# Patient Record
Sex: Female | Born: 1940 | Race: White | Hispanic: No | Marital: Married | Smoking: Never smoker
Health system: Southern US, Community
[De-identification: ages and names within clinical notes are randomized; demographics above are authoritative.]

## PROBLEM LIST (undated history)

## (undated) DIAGNOSIS — F32A Depression, unspecified: Secondary | ICD-10-CM

## (undated) DIAGNOSIS — I10 Essential (primary) hypertension: Secondary | ICD-10-CM

## (undated) DIAGNOSIS — K589 Irritable bowel syndrome without diarrhea: Secondary | ICD-10-CM

## (undated) DIAGNOSIS — E559 Vitamin D deficiency, unspecified: Secondary | ICD-10-CM

## (undated) DIAGNOSIS — M1990 Unspecified osteoarthritis, unspecified site: Secondary | ICD-10-CM

## (undated) DIAGNOSIS — C801 Malignant (primary) neoplasm, unspecified: Secondary | ICD-10-CM

## (undated) DIAGNOSIS — E119 Type 2 diabetes mellitus without complications: Secondary | ICD-10-CM

## (undated) DIAGNOSIS — R413 Other amnesia: Secondary | ICD-10-CM

## (undated) DIAGNOSIS — H409 Unspecified glaucoma: Secondary | ICD-10-CM

## (undated) DIAGNOSIS — C50919 Malignant neoplasm of unspecified site of unspecified female breast: Secondary | ICD-10-CM

## (undated) DIAGNOSIS — G510 Bell's palsy: Secondary | ICD-10-CM

## (undated) HISTORY — DX: Malignant neoplasm of unspecified site of unspecified female breast: C50.919

## (undated) HISTORY — DX: Irritable bowel syndrome, unspecified: K58.9

## (undated) HISTORY — PX: LAPAROSCOPIC OVARIAN CYSTECTOMY: SHX6248

## (undated) HISTORY — PX: TONSILLECTOMY: SUR1361

## (undated) HISTORY — DX: Unspecified glaucoma: H40.9

## (undated) HISTORY — DX: Other amnesia: R41.3

## (undated) HISTORY — DX: Depression, unspecified: F32.A

## (undated) HISTORY — DX: Vitamin D deficiency, unspecified: E55.9

## (undated) HISTORY — DX: Bell's palsy: G51.0

## (undated) HISTORY — PX: APPENDECTOMY: SHX54

---

## 1997-07-29 DIAGNOSIS — C801 Malignant (primary) neoplasm, unspecified: Secondary | ICD-10-CM

## 1997-07-29 HISTORY — PX: MASTECTOMY: SHX3

## 1997-07-29 HISTORY — DX: Malignant (primary) neoplasm, unspecified: C80.1

## 1997-09-23 ENCOUNTER — Ambulatory Visit (HOSPITAL_COMMUNITY): Admission: RE | Admit: 1997-09-23 | Discharge: 1997-09-23 | Payer: Self-pay | Admitting: Hematology and Oncology

## 1998-04-11 ENCOUNTER — Other Ambulatory Visit: Admission: RE | Admit: 1998-04-11 | Discharge: 1998-04-11 | Payer: Self-pay | Admitting: *Deleted

## 1998-04-19 ENCOUNTER — Other Ambulatory Visit: Admission: RE | Admit: 1998-04-19 | Discharge: 1998-04-19 | Payer: Self-pay | Admitting: *Deleted

## 1998-08-17 ENCOUNTER — Ambulatory Visit (HOSPITAL_COMMUNITY): Admission: RE | Admit: 1998-08-17 | Discharge: 1998-08-17 | Payer: Self-pay | Admitting: Hematology and Oncology

## 1999-04-23 ENCOUNTER — Other Ambulatory Visit: Admission: RE | Admit: 1999-04-23 | Discharge: 1999-04-23 | Payer: Self-pay | Admitting: *Deleted

## 2000-01-04 ENCOUNTER — Encounter: Admission: RE | Admit: 2000-01-04 | Discharge: 2000-01-04 | Payer: Self-pay | Admitting: General Surgery

## 2000-01-04 ENCOUNTER — Encounter: Payer: Self-pay | Admitting: General Surgery

## 2000-09-25 ENCOUNTER — Encounter: Admission: RE | Admit: 2000-09-25 | Discharge: 2000-09-25 | Payer: Self-pay | Admitting: General Surgery

## 2000-09-25 ENCOUNTER — Encounter: Payer: Self-pay | Admitting: General Surgery

## 2001-06-23 ENCOUNTER — Encounter: Admission: RE | Admit: 2001-06-23 | Discharge: 2001-09-21 | Payer: Self-pay | Admitting: Gastroenterology

## 2001-09-29 ENCOUNTER — Encounter: Admission: RE | Admit: 2001-09-29 | Discharge: 2001-12-28 | Payer: Self-pay | Admitting: Gastroenterology

## 2001-11-19 ENCOUNTER — Encounter: Payer: Self-pay | Admitting: General Surgery

## 2001-11-19 ENCOUNTER — Ambulatory Visit (HOSPITAL_COMMUNITY): Admission: RE | Admit: 2001-11-19 | Discharge: 2001-11-19 | Payer: Self-pay | Admitting: General Surgery

## 2002-06-29 ENCOUNTER — Other Ambulatory Visit: Admission: RE | Admit: 2002-06-29 | Discharge: 2002-06-29 | Payer: Self-pay | Admitting: *Deleted

## 2002-09-06 ENCOUNTER — Encounter: Admission: RE | Admit: 2002-09-06 | Discharge: 2002-09-06 | Payer: Self-pay | Admitting: Internal Medicine

## 2002-09-06 ENCOUNTER — Encounter: Payer: Self-pay | Admitting: Internal Medicine

## 2002-09-22 ENCOUNTER — Ambulatory Visit (HOSPITAL_COMMUNITY): Admission: RE | Admit: 2002-09-22 | Discharge: 2002-09-22 | Payer: Self-pay | Admitting: Gastroenterology

## 2002-10-05 ENCOUNTER — Encounter: Admission: RE | Admit: 2002-10-05 | Discharge: 2002-10-05 | Payer: Self-pay | Admitting: General Surgery

## 2002-10-05 ENCOUNTER — Encounter: Payer: Self-pay | Admitting: General Surgery

## 2005-06-18 ENCOUNTER — Encounter: Admission: RE | Admit: 2005-06-18 | Discharge: 2005-06-18 | Payer: Self-pay | Admitting: General Surgery

## 2006-05-20 ENCOUNTER — Other Ambulatory Visit: Admission: RE | Admit: 2006-05-20 | Discharge: 2006-05-20 | Payer: Self-pay | Admitting: Obstetrics and Gynecology

## 2008-03-08 ENCOUNTER — Encounter: Admission: RE | Admit: 2008-03-08 | Discharge: 2008-03-08 | Payer: Self-pay | Admitting: Gastroenterology

## 2011-10-04 DIAGNOSIS — E1149 Type 2 diabetes mellitus with other diabetic neurological complication: Secondary | ICD-10-CM | POA: Diagnosis not present

## 2011-10-04 DIAGNOSIS — E1349 Other specified diabetes mellitus with other diabetic neurological complication: Secondary | ICD-10-CM | POA: Diagnosis not present

## 2011-10-04 DIAGNOSIS — Z1331 Encounter for screening for depression: Secondary | ICD-10-CM | POA: Diagnosis not present

## 2011-10-04 DIAGNOSIS — I10 Essential (primary) hypertension: Secondary | ICD-10-CM | POA: Diagnosis not present

## 2011-11-05 DIAGNOSIS — H524 Presbyopia: Secondary | ICD-10-CM | POA: Diagnosis not present

## 2011-11-05 DIAGNOSIS — H40019 Open angle with borderline findings, low risk, unspecified eye: Secondary | ICD-10-CM | POA: Diagnosis not present

## 2011-11-05 DIAGNOSIS — H2510 Age-related nuclear cataract, unspecified eye: Secondary | ICD-10-CM | POA: Diagnosis not present

## 2011-11-05 DIAGNOSIS — E119 Type 2 diabetes mellitus without complications: Secondary | ICD-10-CM | POA: Diagnosis not present

## 2012-04-03 DIAGNOSIS — I10 Essential (primary) hypertension: Secondary | ICD-10-CM | POA: Diagnosis not present

## 2012-04-03 DIAGNOSIS — G479 Sleep disorder, unspecified: Secondary | ICD-10-CM | POA: Diagnosis not present

## 2012-04-03 DIAGNOSIS — E1149 Type 2 diabetes mellitus with other diabetic neurological complication: Secondary | ICD-10-CM | POA: Diagnosis not present

## 2012-04-03 DIAGNOSIS — L299 Pruritus, unspecified: Secondary | ICD-10-CM | POA: Diagnosis not present

## 2012-04-03 DIAGNOSIS — E1349 Other specified diabetes mellitus with other diabetic neurological complication: Secondary | ICD-10-CM | POA: Diagnosis not present

## 2012-04-03 DIAGNOSIS — E7800 Pure hypercholesterolemia, unspecified: Secondary | ICD-10-CM | POA: Diagnosis not present

## 2012-05-04 DIAGNOSIS — H524 Presbyopia: Secondary | ICD-10-CM | POA: Diagnosis not present

## 2012-05-04 DIAGNOSIS — H40019 Open angle with borderline findings, low risk, unspecified eye: Secondary | ICD-10-CM | POA: Diagnosis not present

## 2012-10-13 DIAGNOSIS — E1149 Type 2 diabetes mellitus with other diabetic neurological complication: Secondary | ICD-10-CM | POA: Diagnosis not present

## 2012-10-13 DIAGNOSIS — G587 Mononeuritis multiplex: Secondary | ICD-10-CM | POA: Diagnosis not present

## 2012-10-20 DIAGNOSIS — Z1331 Encounter for screening for depression: Secondary | ICD-10-CM | POA: Diagnosis not present

## 2012-10-20 DIAGNOSIS — G587 Mononeuritis multiplex: Secondary | ICD-10-CM | POA: Diagnosis not present

## 2012-10-20 DIAGNOSIS — Z0000 Encounter for general adult medical examination without abnormal findings: Secondary | ICD-10-CM | POA: Diagnosis not present

## 2012-10-20 DIAGNOSIS — I10 Essential (primary) hypertension: Secondary | ICD-10-CM | POA: Diagnosis not present

## 2012-10-20 DIAGNOSIS — E7800 Pure hypercholesterolemia, unspecified: Secondary | ICD-10-CM | POA: Diagnosis not present

## 2012-10-20 DIAGNOSIS — G479 Sleep disorder, unspecified: Secondary | ICD-10-CM | POA: Diagnosis not present

## 2013-04-20 DIAGNOSIS — Z79899 Other long term (current) drug therapy: Secondary | ICD-10-CM | POA: Diagnosis not present

## 2013-04-20 DIAGNOSIS — G479 Sleep disorder, unspecified: Secondary | ICD-10-CM | POA: Diagnosis not present

## 2013-04-20 DIAGNOSIS — E7800 Pure hypercholesterolemia, unspecified: Secondary | ICD-10-CM | POA: Diagnosis not present

## 2013-04-20 DIAGNOSIS — I10 Essential (primary) hypertension: Secondary | ICD-10-CM | POA: Diagnosis not present

## 2013-05-04 DIAGNOSIS — H40019 Open angle with borderline findings, low risk, unspecified eye: Secondary | ICD-10-CM | POA: Diagnosis not present

## 2013-05-04 DIAGNOSIS — H2510 Age-related nuclear cataract, unspecified eye: Secondary | ICD-10-CM | POA: Diagnosis not present

## 2013-05-04 DIAGNOSIS — E119 Type 2 diabetes mellitus without complications: Secondary | ICD-10-CM | POA: Diagnosis not present

## 2013-05-04 DIAGNOSIS — H5200 Hypermetropia, unspecified eye: Secondary | ICD-10-CM | POA: Diagnosis not present

## 2013-10-25 DIAGNOSIS — G587 Mononeuritis multiplex: Secondary | ICD-10-CM | POA: Diagnosis not present

## 2013-10-25 DIAGNOSIS — Z0000 Encounter for general adult medical examination without abnormal findings: Secondary | ICD-10-CM | POA: Diagnosis not present

## 2013-10-25 DIAGNOSIS — I10 Essential (primary) hypertension: Secondary | ICD-10-CM | POA: Diagnosis not present

## 2013-10-25 DIAGNOSIS — Z79899 Other long term (current) drug therapy: Secondary | ICD-10-CM | POA: Diagnosis not present

## 2013-10-25 DIAGNOSIS — G479 Sleep disorder, unspecified: Secondary | ICD-10-CM | POA: Diagnosis not present

## 2013-10-25 DIAGNOSIS — E7800 Pure hypercholesterolemia, unspecified: Secondary | ICD-10-CM | POA: Diagnosis not present

## 2013-10-25 DIAGNOSIS — Z1331 Encounter for screening for depression: Secondary | ICD-10-CM | POA: Diagnosis not present

## 2013-10-25 DIAGNOSIS — IMO0001 Reserved for inherently not codable concepts without codable children: Secondary | ICD-10-CM | POA: Diagnosis not present

## 2013-10-25 DIAGNOSIS — R209 Unspecified disturbances of skin sensation: Secondary | ICD-10-CM | POA: Diagnosis not present

## 2013-11-12 DIAGNOSIS — H409 Unspecified glaucoma: Secondary | ICD-10-CM | POA: Diagnosis not present

## 2013-11-12 DIAGNOSIS — H4011X0 Primary open-angle glaucoma, stage unspecified: Secondary | ICD-10-CM | POA: Diagnosis not present

## 2014-04-01 DIAGNOSIS — H4010X0 Unspecified open-angle glaucoma, stage unspecified: Secondary | ICD-10-CM | POA: Diagnosis not present

## 2014-05-03 DIAGNOSIS — I10 Essential (primary) hypertension: Secondary | ICD-10-CM | POA: Diagnosis not present

## 2014-05-03 DIAGNOSIS — E1165 Type 2 diabetes mellitus with hyperglycemia: Secondary | ICD-10-CM | POA: Diagnosis not present

## 2014-05-03 DIAGNOSIS — Z0001 Encounter for general adult medical examination with abnormal findings: Secondary | ICD-10-CM | POA: Diagnosis not present

## 2014-06-03 DIAGNOSIS — I10 Essential (primary) hypertension: Secondary | ICD-10-CM | POA: Diagnosis not present

## 2014-06-03 DIAGNOSIS — I739 Peripheral vascular disease, unspecified: Secondary | ICD-10-CM | POA: Diagnosis not present

## 2014-06-03 DIAGNOSIS — E1140 Type 2 diabetes mellitus with diabetic neuropathy, unspecified: Secondary | ICD-10-CM | POA: Diagnosis not present

## 2014-06-03 DIAGNOSIS — L299 Pruritus, unspecified: Secondary | ICD-10-CM | POA: Diagnosis not present

## 2014-06-03 DIAGNOSIS — E1165 Type 2 diabetes mellitus with hyperglycemia: Secondary | ICD-10-CM | POA: Diagnosis not present

## 2014-10-03 DIAGNOSIS — H4011X1 Primary open-angle glaucoma, mild stage: Secondary | ICD-10-CM | POA: Diagnosis not present

## 2014-11-04 DIAGNOSIS — H4011X1 Primary open-angle glaucoma, mild stage: Secondary | ICD-10-CM | POA: Diagnosis not present

## 2014-11-08 DIAGNOSIS — I739 Peripheral vascular disease, unspecified: Secondary | ICD-10-CM | POA: Diagnosis not present

## 2014-11-08 DIAGNOSIS — E0840 Diabetes mellitus due to underlying condition with diabetic neuropathy, unspecified: Secondary | ICD-10-CM | POA: Diagnosis not present

## 2014-11-08 DIAGNOSIS — Z1389 Encounter for screening for other disorder: Secondary | ICD-10-CM | POA: Diagnosis not present

## 2014-11-08 DIAGNOSIS — Z0001 Encounter for general adult medical examination with abnormal findings: Secondary | ICD-10-CM | POA: Diagnosis not present

## 2014-11-08 DIAGNOSIS — E1165 Type 2 diabetes mellitus with hyperglycemia: Secondary | ICD-10-CM | POA: Diagnosis not present

## 2014-11-08 DIAGNOSIS — G479 Sleep disorder, unspecified: Secondary | ICD-10-CM | POA: Diagnosis not present

## 2014-11-08 DIAGNOSIS — I10 Essential (primary) hypertension: Secondary | ICD-10-CM | POA: Diagnosis not present

## 2014-11-08 DIAGNOSIS — E1140 Type 2 diabetes mellitus with diabetic neuropathy, unspecified: Secondary | ICD-10-CM | POA: Diagnosis not present

## 2015-05-04 DIAGNOSIS — H401131 Primary open-angle glaucoma, bilateral, mild stage: Secondary | ICD-10-CM | POA: Diagnosis not present

## 2015-05-11 DIAGNOSIS — H401131 Primary open-angle glaucoma, bilateral, mild stage: Secondary | ICD-10-CM | POA: Diagnosis not present

## 2015-06-12 DIAGNOSIS — G479 Sleep disorder, unspecified: Secondary | ICD-10-CM | POA: Diagnosis not present

## 2015-06-12 DIAGNOSIS — B839 Helminthiasis, unspecified: Secondary | ICD-10-CM | POA: Diagnosis not present

## 2015-06-12 DIAGNOSIS — E0840 Diabetes mellitus due to underlying condition with diabetic neuropathy, unspecified: Secondary | ICD-10-CM | POA: Diagnosis not present

## 2015-06-12 DIAGNOSIS — I10 Essential (primary) hypertension: Secondary | ICD-10-CM | POA: Diagnosis not present

## 2015-06-12 DIAGNOSIS — I739 Peripheral vascular disease, unspecified: Secondary | ICD-10-CM | POA: Diagnosis not present

## 2015-06-12 DIAGNOSIS — E1165 Type 2 diabetes mellitus with hyperglycemia: Secondary | ICD-10-CM | POA: Diagnosis not present

## 2015-11-08 DIAGNOSIS — R3915 Urgency of urination: Secondary | ICD-10-CM | POA: Diagnosis not present

## 2015-11-16 DIAGNOSIS — H401131 Primary open-angle glaucoma, bilateral, mild stage: Secondary | ICD-10-CM | POA: Diagnosis not present

## 2015-11-20 DIAGNOSIS — Z7984 Long term (current) use of oral hypoglycemic drugs: Secondary | ICD-10-CM | POA: Diagnosis not present

## 2015-11-20 DIAGNOSIS — Z79899 Other long term (current) drug therapy: Secondary | ICD-10-CM | POA: Diagnosis not present

## 2015-11-20 DIAGNOSIS — B839 Helminthiasis, unspecified: Secondary | ICD-10-CM | POA: Diagnosis not present

## 2015-11-20 DIAGNOSIS — Z1389 Encounter for screening for other disorder: Secondary | ICD-10-CM | POA: Diagnosis not present

## 2015-11-20 DIAGNOSIS — E1140 Type 2 diabetes mellitus with diabetic neuropathy, unspecified: Secondary | ICD-10-CM | POA: Diagnosis not present

## 2015-11-20 DIAGNOSIS — E1165 Type 2 diabetes mellitus with hyperglycemia: Secondary | ICD-10-CM | POA: Diagnosis not present

## 2015-11-20 DIAGNOSIS — I10 Essential (primary) hypertension: Secondary | ICD-10-CM | POA: Diagnosis not present

## 2015-11-20 DIAGNOSIS — E559 Vitamin D deficiency, unspecified: Secondary | ICD-10-CM | POA: Diagnosis not present

## 2015-11-20 DIAGNOSIS — E785 Hyperlipidemia, unspecified: Secondary | ICD-10-CM | POA: Diagnosis not present

## 2015-11-20 DIAGNOSIS — Z0001 Encounter for general adult medical examination with abnormal findings: Secondary | ICD-10-CM | POA: Diagnosis not present

## 2015-11-20 DIAGNOSIS — I739 Peripheral vascular disease, unspecified: Secondary | ICD-10-CM | POA: Diagnosis not present

## 2016-01-09 ENCOUNTER — Other Ambulatory Visit: Payer: Self-pay | Admitting: Internal Medicine

## 2016-01-09 DIAGNOSIS — N644 Mastodynia: Secondary | ICD-10-CM

## 2016-01-10 ENCOUNTER — Other Ambulatory Visit: Payer: Self-pay | Admitting: Internal Medicine

## 2016-01-10 DIAGNOSIS — N644 Mastodynia: Secondary | ICD-10-CM

## 2016-01-16 ENCOUNTER — Ambulatory Visit
Admission: RE | Admit: 2016-01-16 | Discharge: 2016-01-16 | Disposition: A | Discharge status: Home / Self Care | Payer: Medicare Other | Source: Ambulatory Visit | Attending: Internal Medicine | Admitting: Internal Medicine

## 2016-01-16 ENCOUNTER — Other Ambulatory Visit: Payer: Self-pay | Admitting: Internal Medicine

## 2016-01-16 DIAGNOSIS — N644 Mastodynia: Secondary | ICD-10-CM

## 2016-01-16 DIAGNOSIS — R922 Inconclusive mammogram: Secondary | ICD-10-CM | POA: Diagnosis not present

## 2016-02-02 DIAGNOSIS — I739 Peripheral vascular disease, unspecified: Secondary | ICD-10-CM | POA: Diagnosis not present

## 2016-02-02 DIAGNOSIS — B839 Helminthiasis, unspecified: Secondary | ICD-10-CM | POA: Diagnosis not present

## 2016-02-02 DIAGNOSIS — E1165 Type 2 diabetes mellitus with hyperglycemia: Secondary | ICD-10-CM | POA: Diagnosis not present

## 2016-02-02 DIAGNOSIS — E785 Hyperlipidemia, unspecified: Secondary | ICD-10-CM | POA: Diagnosis not present

## 2016-02-02 DIAGNOSIS — E1140 Type 2 diabetes mellitus with diabetic neuropathy, unspecified: Secondary | ICD-10-CM | POA: Diagnosis not present

## 2016-02-02 DIAGNOSIS — I10 Essential (primary) hypertension: Secondary | ICD-10-CM | POA: Diagnosis not present

## 2016-02-02 DIAGNOSIS — E559 Vitamin D deficiency, unspecified: Secondary | ICD-10-CM | POA: Diagnosis not present

## 2016-02-02 DIAGNOSIS — Z7984 Long term (current) use of oral hypoglycemic drugs: Secondary | ICD-10-CM | POA: Diagnosis not present

## 2016-05-15 DIAGNOSIS — H401131 Primary open-angle glaucoma, bilateral, mild stage: Secondary | ICD-10-CM | POA: Diagnosis not present

## 2016-05-23 DIAGNOSIS — E119 Type 2 diabetes mellitus without complications: Secondary | ICD-10-CM | POA: Diagnosis not present

## 2016-08-13 ENCOUNTER — Other Ambulatory Visit: Payer: Self-pay | Admitting: Internal Medicine

## 2016-08-13 DIAGNOSIS — M7989 Other specified soft tissue disorders: Secondary | ICD-10-CM

## 2016-08-13 DIAGNOSIS — E1165 Type 2 diabetes mellitus with hyperglycemia: Secondary | ICD-10-CM | POA: Diagnosis not present

## 2016-08-13 DIAGNOSIS — Z7984 Long term (current) use of oral hypoglycemic drugs: Secondary | ICD-10-CM | POA: Diagnosis not present

## 2016-08-13 DIAGNOSIS — R202 Paresthesia of skin: Secondary | ICD-10-CM | POA: Diagnosis not present

## 2016-08-13 DIAGNOSIS — M79672 Pain in left foot: Secondary | ICD-10-CM | POA: Diagnosis not present

## 2016-08-14 ENCOUNTER — Other Ambulatory Visit: Payer: Medicare Other

## 2016-08-15 ENCOUNTER — Other Ambulatory Visit: Payer: Self-pay | Admitting: Internal Medicine

## 2016-08-15 DIAGNOSIS — M7989 Other specified soft tissue disorders: Secondary | ICD-10-CM

## 2016-08-16 ENCOUNTER — Ambulatory Visit
Admission: RE | Admit: 2016-08-16 | Discharge: 2016-08-16 | Disposition: A | Discharge status: Home / Self Care | Payer: Medicare Other | Source: Ambulatory Visit | Attending: Internal Medicine | Admitting: Internal Medicine

## 2016-08-16 DIAGNOSIS — M7989 Other specified soft tissue disorders: Secondary | ICD-10-CM

## 2016-08-16 DIAGNOSIS — R600 Localized edema: Secondary | ICD-10-CM | POA: Diagnosis not present

## 2016-09-17 DIAGNOSIS — M7989 Other specified soft tissue disorders: Secondary | ICD-10-CM | POA: Diagnosis not present

## 2016-09-17 DIAGNOSIS — R202 Paresthesia of skin: Secondary | ICD-10-CM | POA: Diagnosis not present

## 2016-09-17 DIAGNOSIS — M79672 Pain in left foot: Secondary | ICD-10-CM | POA: Diagnosis not present

## 2016-09-17 DIAGNOSIS — E1165 Type 2 diabetes mellitus with hyperglycemia: Secondary | ICD-10-CM | POA: Diagnosis not present

## 2016-11-19 DIAGNOSIS — H401131 Primary open-angle glaucoma, bilateral, mild stage: Secondary | ICD-10-CM | POA: Diagnosis not present

## 2017-01-06 DIAGNOSIS — R202 Paresthesia of skin: Secondary | ICD-10-CM | POA: Diagnosis not present

## 2017-01-06 DIAGNOSIS — Z7984 Long term (current) use of oral hypoglycemic drugs: Secondary | ICD-10-CM | POA: Diagnosis not present

## 2017-01-06 DIAGNOSIS — E1140 Type 2 diabetes mellitus with diabetic neuropathy, unspecified: Secondary | ICD-10-CM | POA: Diagnosis not present

## 2017-01-06 DIAGNOSIS — I10 Essential (primary) hypertension: Secondary | ICD-10-CM | POA: Diagnosis not present

## 2017-01-06 DIAGNOSIS — E1165 Type 2 diabetes mellitus with hyperglycemia: Secondary | ICD-10-CM | POA: Diagnosis not present

## 2017-01-06 DIAGNOSIS — R1012 Left upper quadrant pain: Secondary | ICD-10-CM | POA: Diagnosis not present

## 2017-01-06 DIAGNOSIS — M79672 Pain in left foot: Secondary | ICD-10-CM | POA: Diagnosis not present

## 2017-01-06 DIAGNOSIS — E559 Vitamin D deficiency, unspecified: Secondary | ICD-10-CM | POA: Diagnosis not present

## 2017-01-06 DIAGNOSIS — Z1211 Encounter for screening for malignant neoplasm of colon: Secondary | ICD-10-CM | POA: Diagnosis not present

## 2017-01-06 DIAGNOSIS — I739 Peripheral vascular disease, unspecified: Secondary | ICD-10-CM | POA: Diagnosis not present

## 2017-01-06 DIAGNOSIS — E785 Hyperlipidemia, unspecified: Secondary | ICD-10-CM | POA: Diagnosis not present

## 2017-01-06 DIAGNOSIS — M7989 Other specified soft tissue disorders: Secondary | ICD-10-CM | POA: Diagnosis not present

## 2017-01-07 ENCOUNTER — Other Ambulatory Visit: Payer: Self-pay | Admitting: Internal Medicine

## 2017-01-07 DIAGNOSIS — R748 Abnormal levels of other serum enzymes: Secondary | ICD-10-CM

## 2017-01-07 DIAGNOSIS — R1012 Left upper quadrant pain: Secondary | ICD-10-CM

## 2017-01-13 ENCOUNTER — Other Ambulatory Visit: Payer: Self-pay | Admitting: Internal Medicine

## 2017-01-13 DIAGNOSIS — Z9011 Acquired absence of right breast and nipple: Secondary | ICD-10-CM

## 2017-01-13 DIAGNOSIS — Z853 Personal history of malignant neoplasm of breast: Secondary | ICD-10-CM

## 2017-01-16 ENCOUNTER — Ambulatory Visit
Admission: RE | Admit: 2017-01-16 | Discharge: 2017-01-16 | Disposition: A | Discharge status: Home / Self Care | Payer: Medicare Other | Source: Ambulatory Visit | Attending: Internal Medicine | Admitting: Internal Medicine

## 2017-01-16 ENCOUNTER — Encounter: Payer: Self-pay | Admitting: Radiology

## 2017-01-16 DIAGNOSIS — R928 Other abnormal and inconclusive findings on diagnostic imaging of breast: Secondary | ICD-10-CM | POA: Diagnosis not present

## 2017-01-16 DIAGNOSIS — Z853 Personal history of malignant neoplasm of breast: Secondary | ICD-10-CM

## 2017-01-16 DIAGNOSIS — Z9011 Acquired absence of right breast and nipple: Secondary | ICD-10-CM

## 2017-01-16 HISTORY — DX: Malignant (primary) neoplasm, unspecified: C80.1

## 2017-01-20 DIAGNOSIS — R748 Abnormal levels of other serum enzymes: Secondary | ICD-10-CM | POA: Diagnosis not present

## 2017-02-19 DIAGNOSIS — G4700 Insomnia, unspecified: Secondary | ICD-10-CM | POA: Diagnosis not present

## 2017-02-19 DIAGNOSIS — I10 Essential (primary) hypertension: Secondary | ICD-10-CM | POA: Diagnosis not present

## 2017-02-19 DIAGNOSIS — E785 Hyperlipidemia, unspecified: Secondary | ICD-10-CM | POA: Diagnosis not present

## 2017-02-19 DIAGNOSIS — I739 Peripheral vascular disease, unspecified: Secondary | ICD-10-CM | POA: Diagnosis not present

## 2017-02-19 DIAGNOSIS — E1140 Type 2 diabetes mellitus with diabetic neuropathy, unspecified: Secondary | ICD-10-CM | POA: Diagnosis not present

## 2017-02-19 DIAGNOSIS — R748 Abnormal levels of other serum enzymes: Secondary | ICD-10-CM | POA: Diagnosis not present

## 2017-02-19 DIAGNOSIS — E1165 Type 2 diabetes mellitus with hyperglycemia: Secondary | ICD-10-CM | POA: Diagnosis not present

## 2017-02-19 DIAGNOSIS — E559 Vitamin D deficiency, unspecified: Secondary | ICD-10-CM | POA: Diagnosis not present

## 2017-05-23 DIAGNOSIS — H401131 Primary open-angle glaucoma, bilateral, mild stage: Secondary | ICD-10-CM | POA: Diagnosis not present

## 2017-05-30 DIAGNOSIS — H40003 Preglaucoma, unspecified, bilateral: Secondary | ICD-10-CM | POA: Diagnosis not present

## 2017-07-16 DIAGNOSIS — E1165 Type 2 diabetes mellitus with hyperglycemia: Secondary | ICD-10-CM | POA: Diagnosis not present

## 2017-07-16 DIAGNOSIS — I10 Essential (primary) hypertension: Secondary | ICD-10-CM | POA: Diagnosis not present

## 2017-07-16 DIAGNOSIS — Z0000 Encounter for general adult medical examination without abnormal findings: Secondary | ICD-10-CM | POA: Diagnosis not present

## 2017-07-16 DIAGNOSIS — R748 Abnormal levels of other serum enzymes: Secondary | ICD-10-CM | POA: Diagnosis not present

## 2017-07-16 DIAGNOSIS — I739 Peripheral vascular disease, unspecified: Secondary | ICD-10-CM | POA: Diagnosis not present

## 2017-07-16 DIAGNOSIS — E785 Hyperlipidemia, unspecified: Secondary | ICD-10-CM | POA: Diagnosis not present

## 2017-07-16 DIAGNOSIS — G4700 Insomnia, unspecified: Secondary | ICD-10-CM | POA: Diagnosis not present

## 2017-07-16 DIAGNOSIS — Z79899 Other long term (current) drug therapy: Secondary | ICD-10-CM | POA: Diagnosis not present

## 2017-07-16 DIAGNOSIS — E559 Vitamin D deficiency, unspecified: Secondary | ICD-10-CM | POA: Diagnosis not present

## 2017-07-16 DIAGNOSIS — Z1389 Encounter for screening for other disorder: Secondary | ICD-10-CM | POA: Diagnosis not present

## 2017-07-16 DIAGNOSIS — E1140 Type 2 diabetes mellitus with diabetic neuropathy, unspecified: Secondary | ICD-10-CM | POA: Diagnosis not present

## 2017-07-16 DIAGNOSIS — Z7189 Other specified counseling: Secondary | ICD-10-CM | POA: Diagnosis not present

## 2017-07-31 DIAGNOSIS — E559 Vitamin D deficiency, unspecified: Secondary | ICD-10-CM | POA: Diagnosis not present

## 2017-07-31 DIAGNOSIS — E782 Mixed hyperlipidemia: Secondary | ICD-10-CM | POA: Diagnosis not present

## 2017-07-31 DIAGNOSIS — I10 Essential (primary) hypertension: Secondary | ICD-10-CM | POA: Diagnosis not present

## 2017-07-31 DIAGNOSIS — E785 Hyperlipidemia, unspecified: Secondary | ICD-10-CM | POA: Diagnosis not present

## 2017-07-31 DIAGNOSIS — R748 Abnormal levels of other serum enzymes: Secondary | ICD-10-CM | POA: Diagnosis not present

## 2017-07-31 DIAGNOSIS — E1165 Type 2 diabetes mellitus with hyperglycemia: Secondary | ICD-10-CM | POA: Diagnosis not present

## 2017-11-27 DIAGNOSIS — H401131 Primary open-angle glaucoma, bilateral, mild stage: Secondary | ICD-10-CM | POA: Diagnosis not present

## 2018-01-15 DIAGNOSIS — R748 Abnormal levels of other serum enzymes: Secondary | ICD-10-CM | POA: Diagnosis not present

## 2018-01-15 DIAGNOSIS — G4700 Insomnia, unspecified: Secondary | ICD-10-CM | POA: Diagnosis not present

## 2018-01-15 DIAGNOSIS — E1165 Type 2 diabetes mellitus with hyperglycemia: Secondary | ICD-10-CM | POA: Diagnosis not present

## 2018-01-15 DIAGNOSIS — Z7984 Long term (current) use of oral hypoglycemic drugs: Secondary | ICD-10-CM | POA: Diagnosis not present

## 2018-01-15 DIAGNOSIS — I10 Essential (primary) hypertension: Secondary | ICD-10-CM | POA: Diagnosis not present

## 2018-01-15 DIAGNOSIS — E559 Vitamin D deficiency, unspecified: Secondary | ICD-10-CM | POA: Diagnosis not present

## 2018-01-15 DIAGNOSIS — E782 Mixed hyperlipidemia: Secondary | ICD-10-CM | POA: Diagnosis not present

## 2018-05-28 DIAGNOSIS — H401131 Primary open-angle glaucoma, bilateral, mild stage: Secondary | ICD-10-CM | POA: Diagnosis not present

## 2018-06-04 DIAGNOSIS — H401131 Primary open-angle glaucoma, bilateral, mild stage: Secondary | ICD-10-CM | POA: Diagnosis not present

## 2018-06-22 IMAGING — MG 2D DIGITAL DIAGNOSTIC UNILATERAL LEFT MAMMOGRAM WITH CAD AND ADJ
6 series · 6 of 14 positions shown · non-contrast
Comparison: Previous exam(s).

CLINICAL DATA: 76-year-old patient with history of right mastectomy
approximately 19 years ago. She has recently had left breast pain as
well as thoracic and abdominal pain. She describes taking some
homeopathic treatment for her pancreas, and has had some improvement
in her pain. She does not palpate any lumps.

EXAM:
2D DIGITAL DIAGNOSTIC UNILATERAL LEFT MAMMOGRAM WITH CAD AND ADJUNCT
TOMO

[L CC synth-2D]
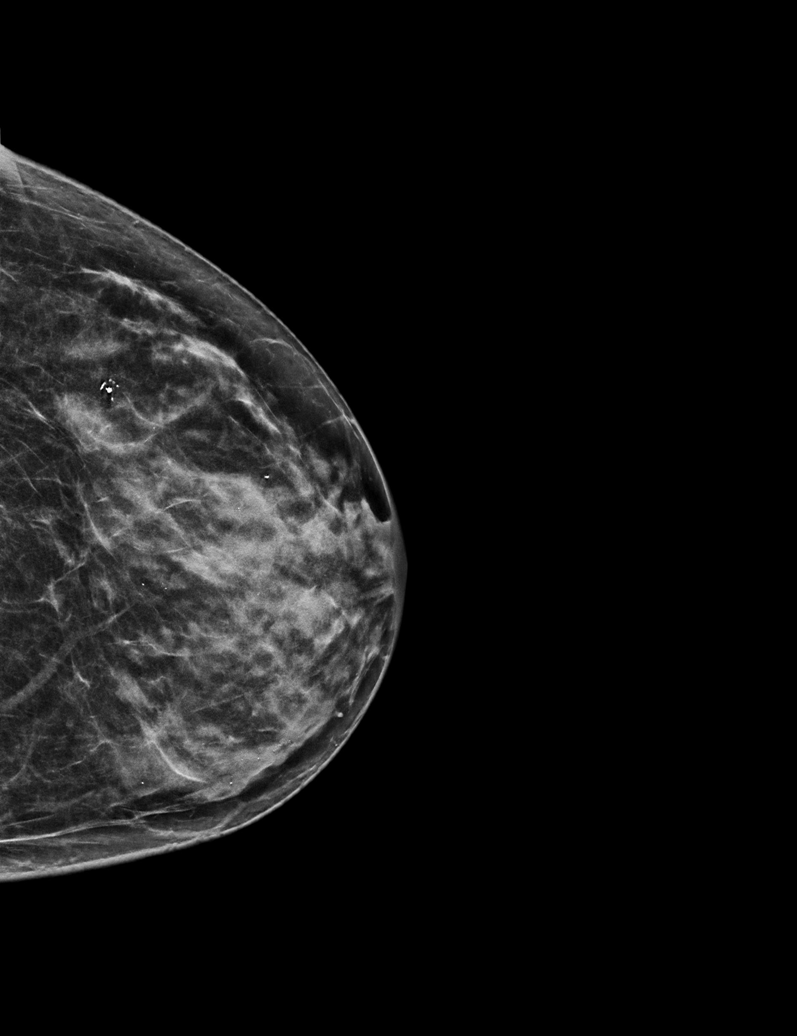

[L MLO]
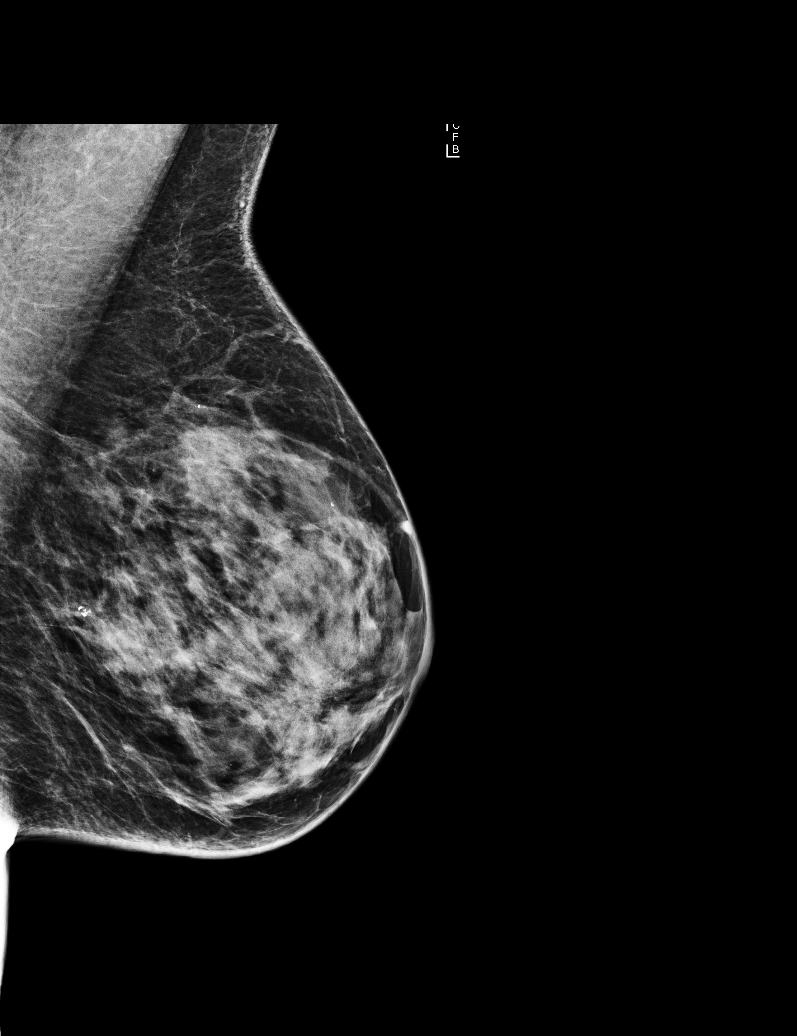

[L MLO synth-2D]
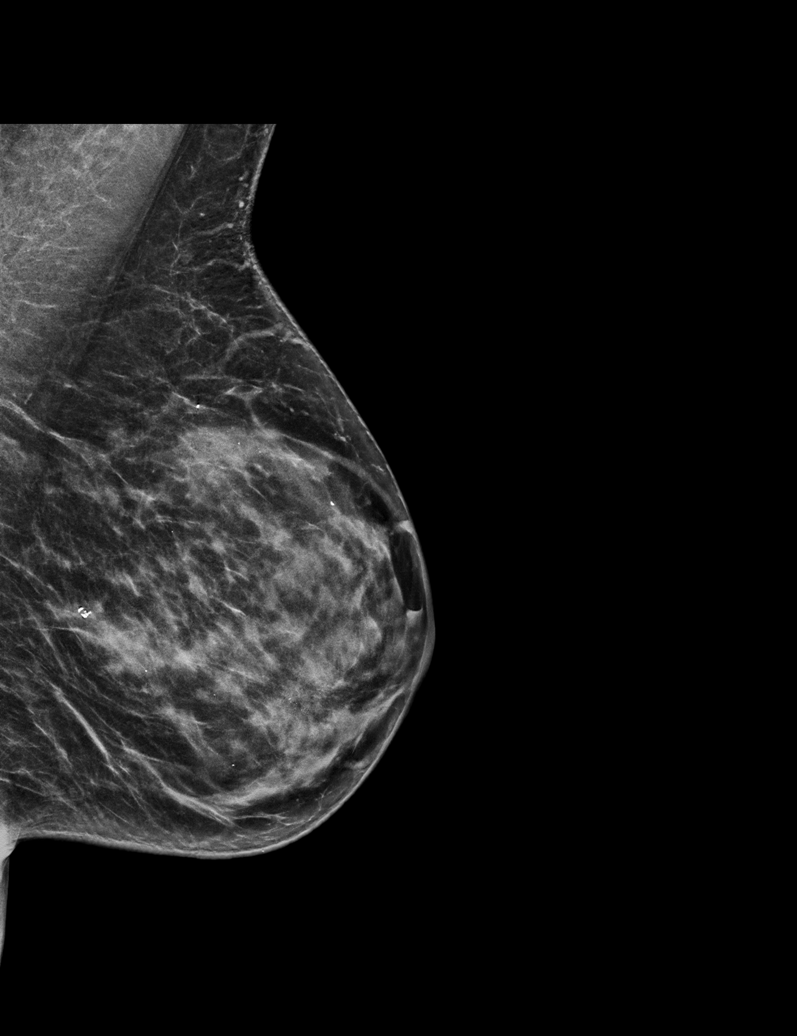

[L CC]
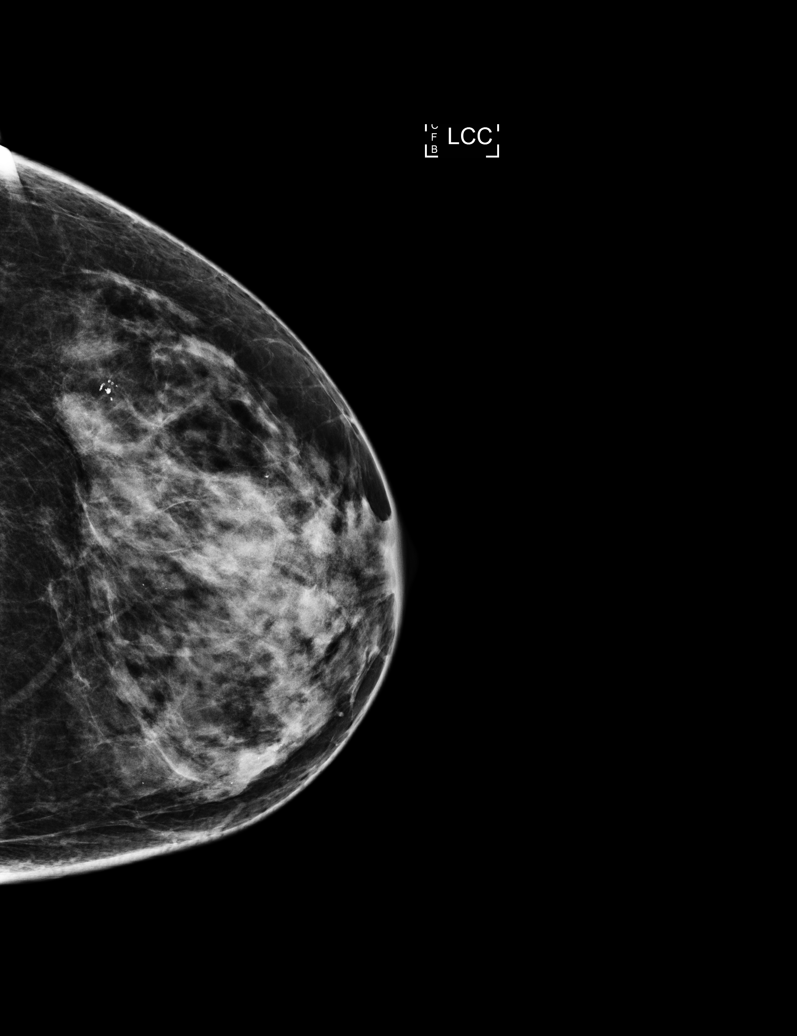

[L MLO tomo · tomo slice 28/55.0]
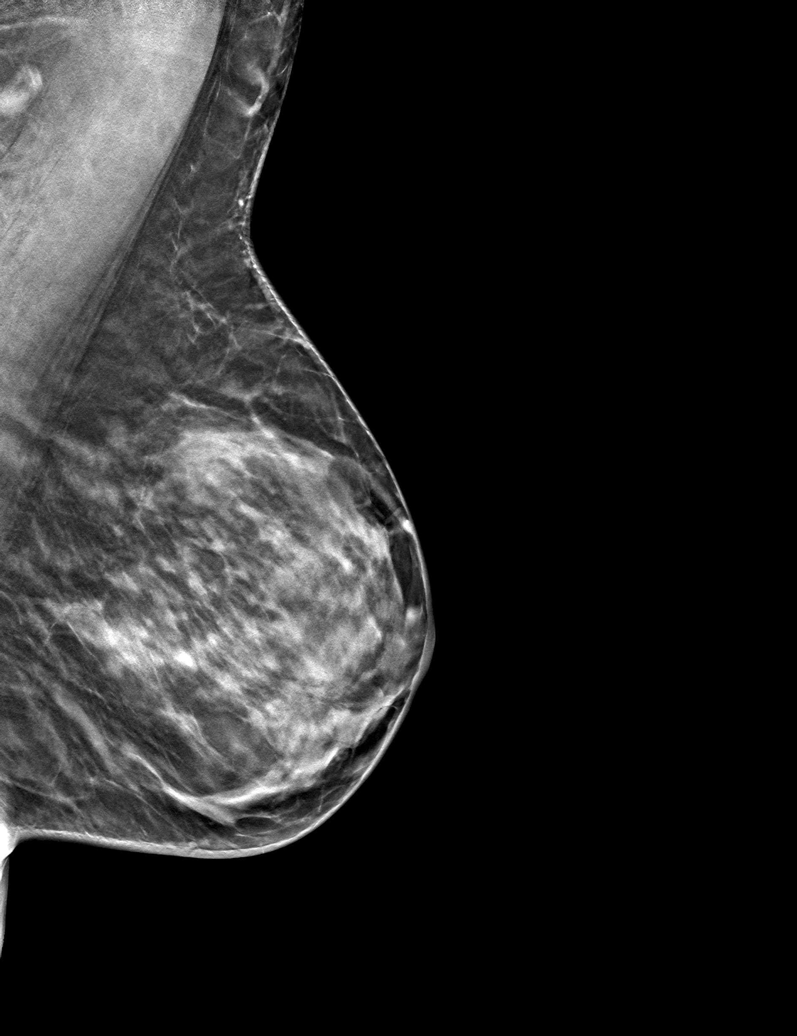

[L CC tomo · tomo slice 25/50.0]
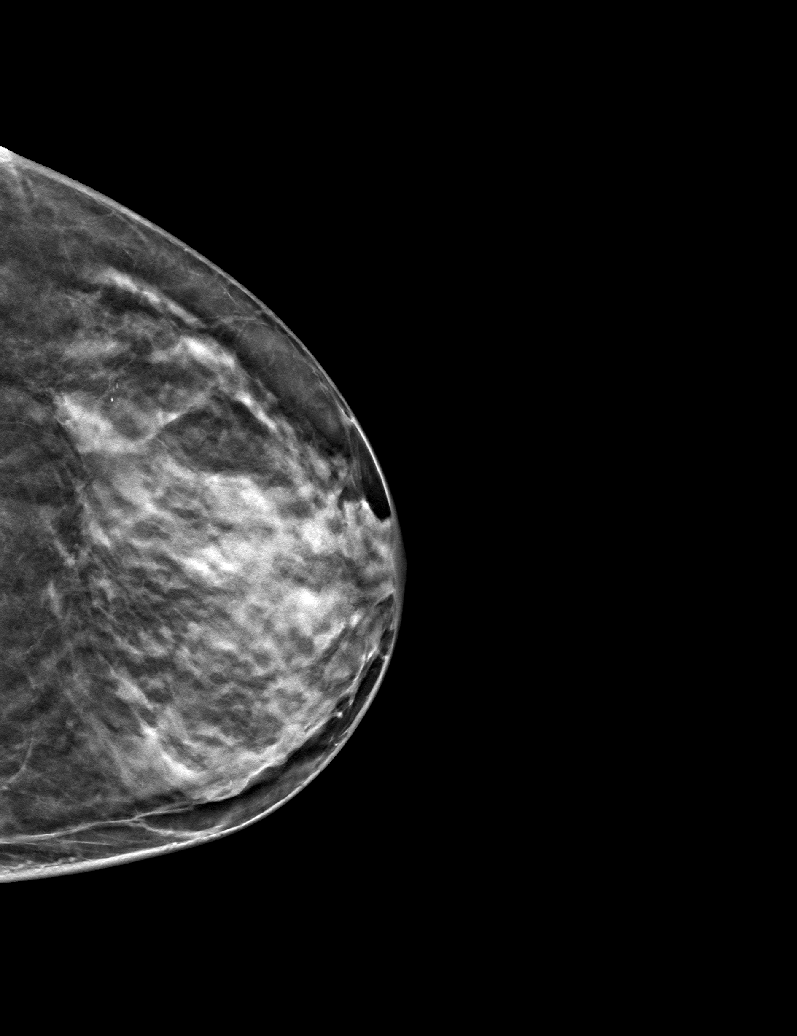

[6 of 14 positions shown; findings below may reference images not displayed]

ACR Breast Density Category c: The breast tissue is heterogeneously
dense, which may obscure small masses.
FINDINGS: No mass, architectural distortion, or suspicious microcalcification
is identified in the left breast to suggest malignancy. Parenchymal
pattern appears stable.

Mammographic images were processed with CAD.
IMPRESSION: No evidence of malignancy in the left breast.

RECOMMENDATION:
Screening mammogram in one year.(Code:C5-D-IUJ)

I have discussed the findings and recommendations with the patient.
Results were also provided in writing at the conclusion of the
visit. If applicable, a reminder letter will be sent to the patient
regarding the next appointment.

BI-RADS CATEGORY  1: Negative.

## 2018-08-18 DIAGNOSIS — E1165 Type 2 diabetes mellitus with hyperglycemia: Secondary | ICD-10-CM | POA: Diagnosis not present

## 2018-08-18 DIAGNOSIS — E559 Vitamin D deficiency, unspecified: Secondary | ICD-10-CM | POA: Diagnosis not present

## 2018-08-18 DIAGNOSIS — I10 Essential (primary) hypertension: Secondary | ICD-10-CM | POA: Diagnosis not present

## 2018-08-18 DIAGNOSIS — E782 Mixed hyperlipidemia: Secondary | ICD-10-CM | POA: Diagnosis not present

## 2018-08-18 DIAGNOSIS — G4700 Insomnia, unspecified: Secondary | ICD-10-CM | POA: Diagnosis not present

## 2018-08-18 DIAGNOSIS — R748 Abnormal levels of other serum enzymes: Secondary | ICD-10-CM | POA: Diagnosis not present

## 2018-08-18 DIAGNOSIS — F4310 Post-traumatic stress disorder, unspecified: Secondary | ICD-10-CM | POA: Diagnosis not present

## 2018-08-18 DIAGNOSIS — Z1389 Encounter for screening for other disorder: Secondary | ICD-10-CM | POA: Diagnosis not present

## 2018-08-18 DIAGNOSIS — Z0000 Encounter for general adult medical examination without abnormal findings: Secondary | ICD-10-CM | POA: Diagnosis not present

## 2018-09-17 DIAGNOSIS — G4700 Insomnia, unspecified: Secondary | ICD-10-CM | POA: Diagnosis not present

## 2018-09-17 DIAGNOSIS — E782 Mixed hyperlipidemia: Secondary | ICD-10-CM | POA: Diagnosis not present

## 2018-09-17 DIAGNOSIS — R748 Abnormal levels of other serum enzymes: Secondary | ICD-10-CM | POA: Diagnosis not present

## 2018-09-17 DIAGNOSIS — E1165 Type 2 diabetes mellitus with hyperglycemia: Secondary | ICD-10-CM | POA: Diagnosis not present

## 2018-09-17 DIAGNOSIS — I10 Essential (primary) hypertension: Secondary | ICD-10-CM | POA: Diagnosis not present

## 2018-09-17 DIAGNOSIS — F4310 Post-traumatic stress disorder, unspecified: Secondary | ICD-10-CM | POA: Diagnosis not present

## 2018-09-17 DIAGNOSIS — E559 Vitamin D deficiency, unspecified: Secondary | ICD-10-CM | POA: Diagnosis not present

## 2019-01-16 IMAGING — US US EXTREM LOW VENOUS BILAT
1 series · 14 of 24 positions shown · non-contrast
Comparison: None.

CLINICAL DATA: Bilateral lower extremity edema

EXAM:
BILATERAL LOWER EXTREMITY VENOUS DUPLEX ULTRASOUND
TECHNIQUE: Doppler venous assessment of the left lower extremity deep venous
system was performed, including characterization of spectral flow,
compressibility, and phasicity.

[Series 1: us extrem low venous bilat · 0.07mm/px · 14 of 68 slices shown]
[im 1/68]
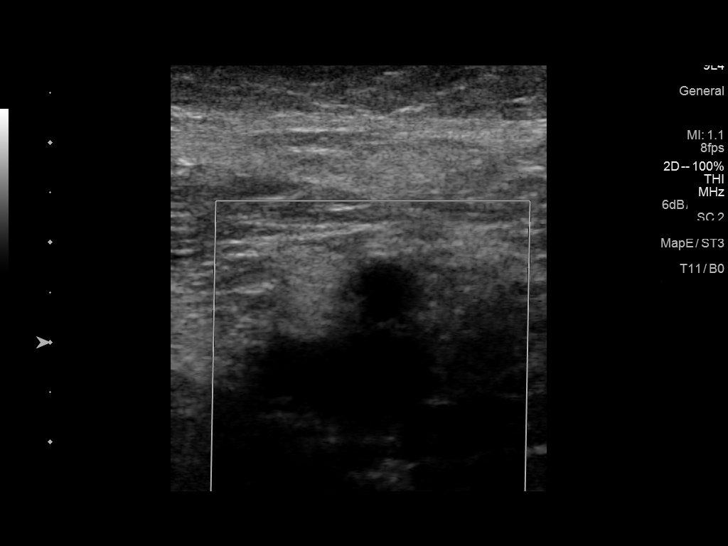
[im 6/68]
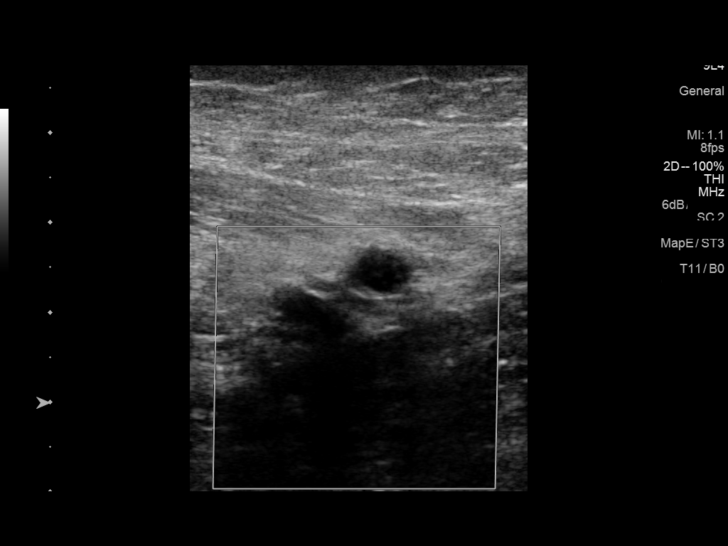
[im 12/68]
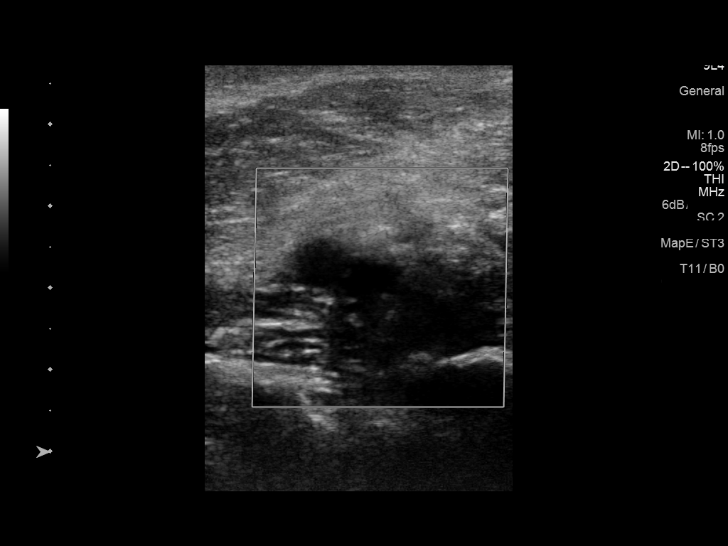
[im 18/68]
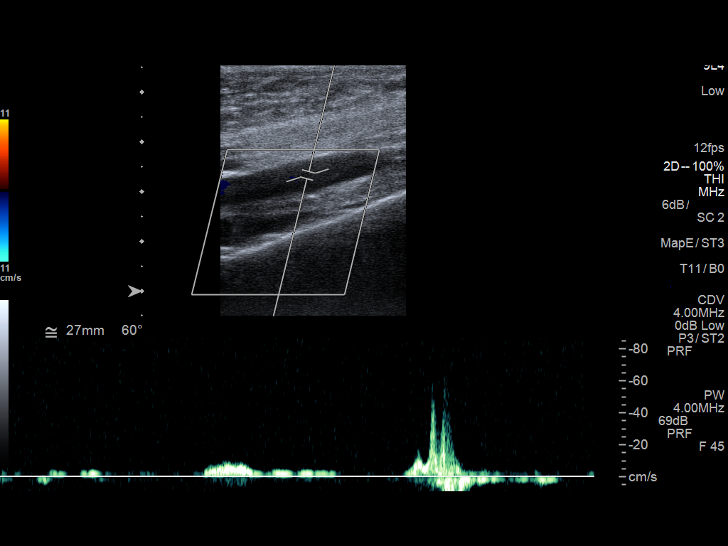
[im 21/68]
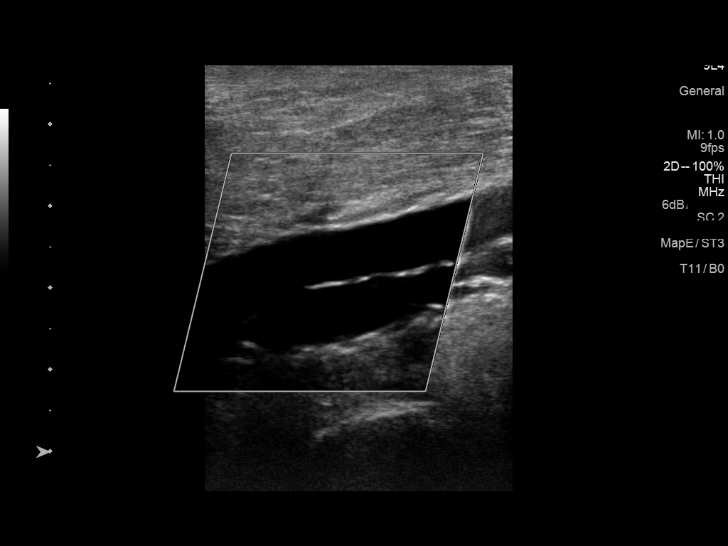
[im 27/68]
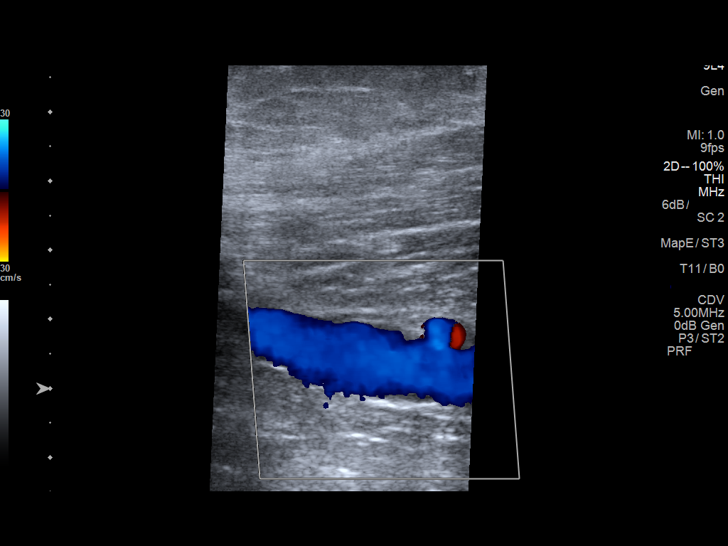
[im 33/68]
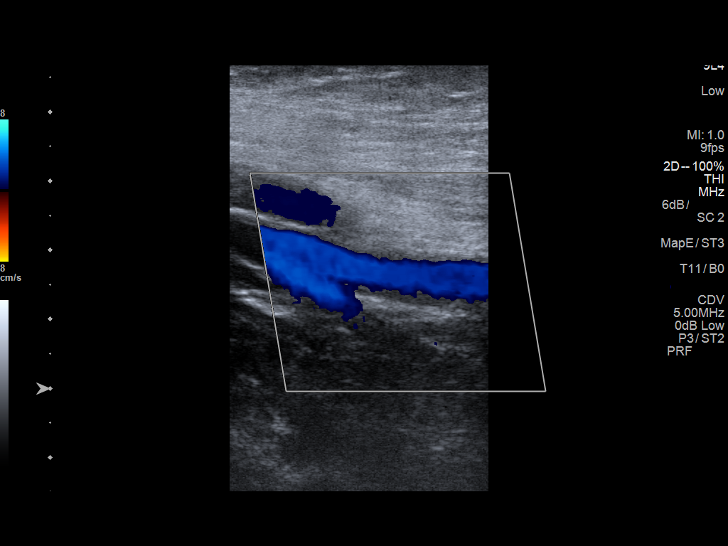
[im 35/68]
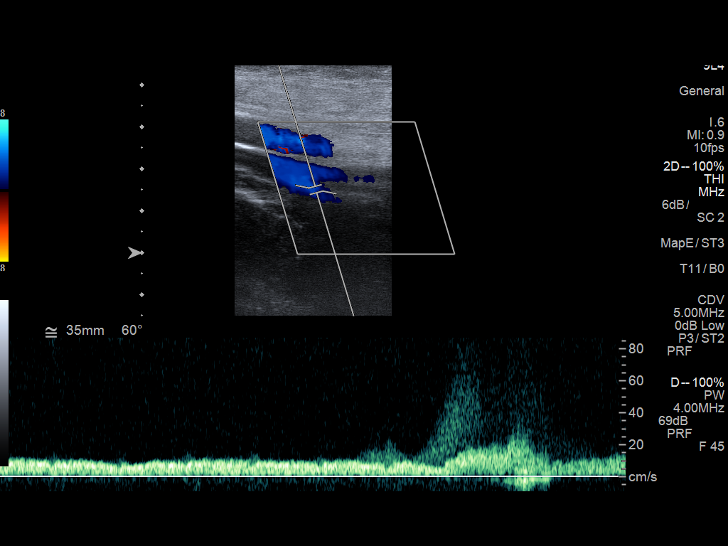
[im 41/68]
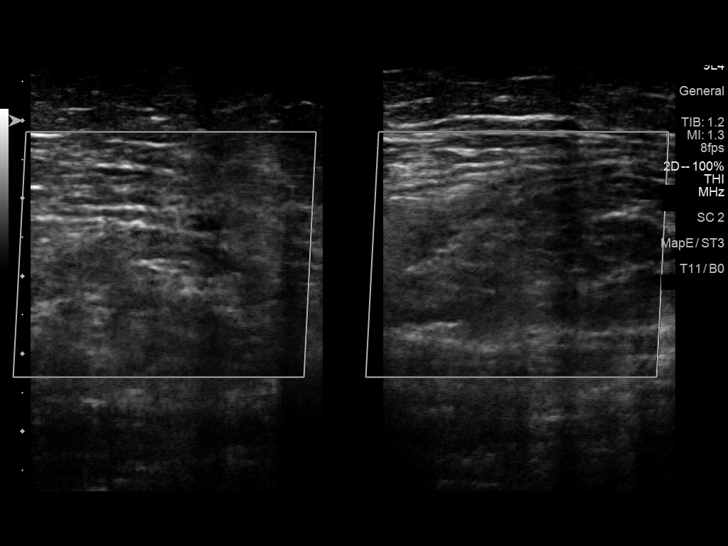
[im 47/68]
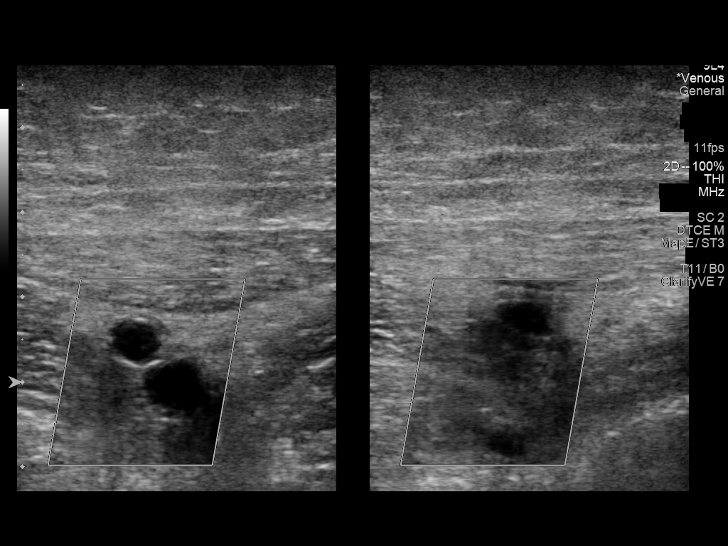
[im 53/68]
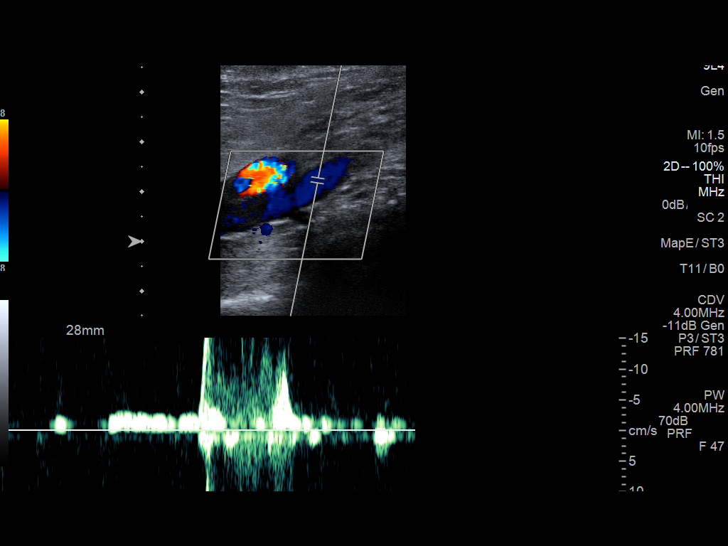
[im 56/68]
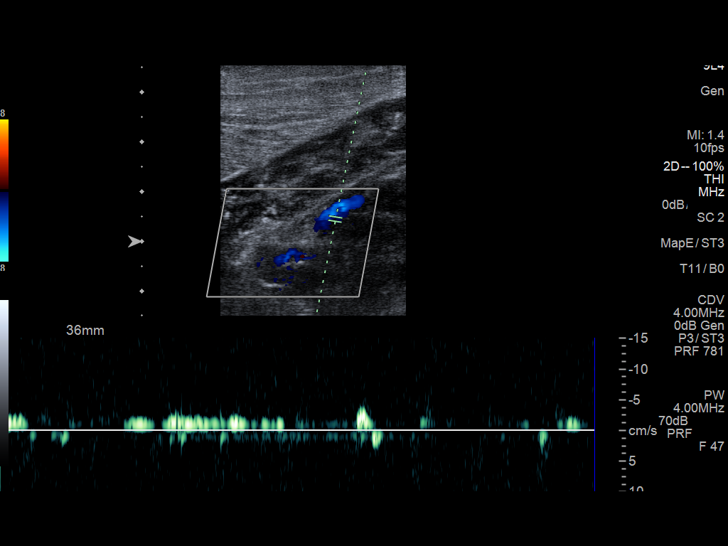
[im 62/68]
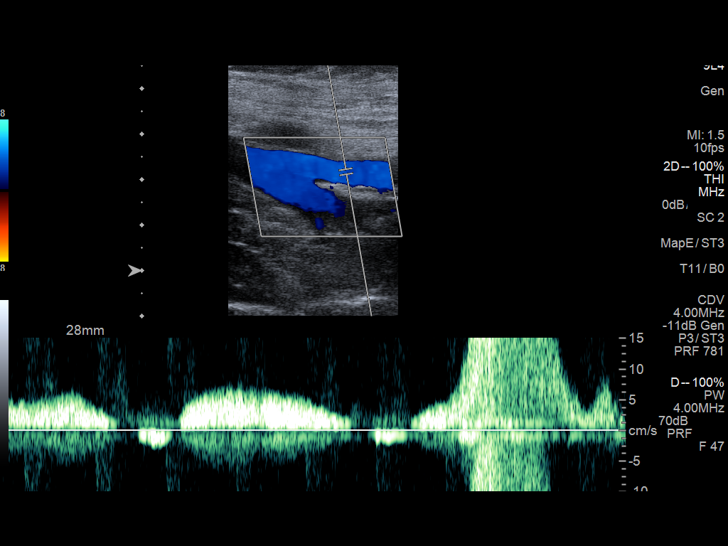
[im 68/68]
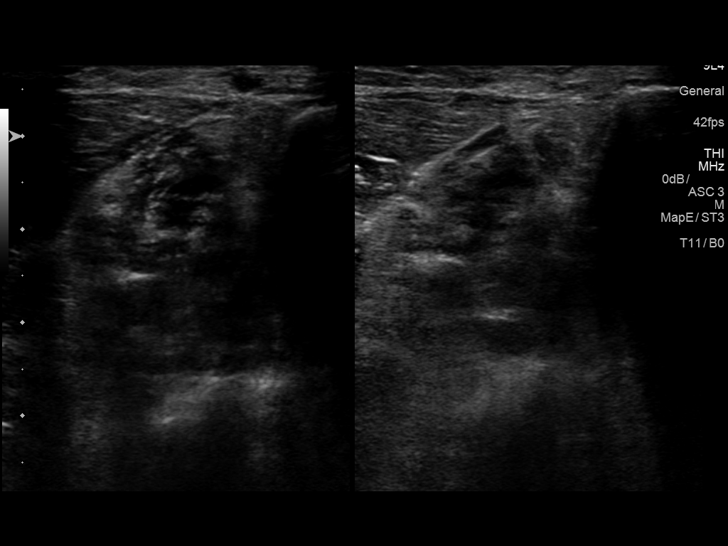

[14 of 24 positions shown; findings below may reference images not displayed]

FINDINGS: There is complete compressibility of the bilateral common femoral,
femoral, and popliteal veins. Doppler analysis demonstrates
respiratory phasicity and augmentation of flow with calf
compression. No obvious superficial vein or calf vein thrombosis.
IMPRESSION: No evidence of lower extremity DVT.

## 2019-02-25 DIAGNOSIS — E782 Mixed hyperlipidemia: Secondary | ICD-10-CM | POA: Diagnosis not present

## 2019-02-25 DIAGNOSIS — F4310 Post-traumatic stress disorder, unspecified: Secondary | ICD-10-CM | POA: Diagnosis not present

## 2019-02-25 DIAGNOSIS — E559 Vitamin D deficiency, unspecified: Secondary | ICD-10-CM | POA: Diagnosis not present

## 2019-02-25 DIAGNOSIS — R252 Cramp and spasm: Secondary | ICD-10-CM | POA: Diagnosis not present

## 2019-02-25 DIAGNOSIS — I10 Essential (primary) hypertension: Secondary | ICD-10-CM | POA: Diagnosis not present

## 2019-02-25 DIAGNOSIS — R748 Abnormal levels of other serum enzymes: Secondary | ICD-10-CM | POA: Diagnosis not present

## 2019-02-25 DIAGNOSIS — E1165 Type 2 diabetes mellitus with hyperglycemia: Secondary | ICD-10-CM | POA: Diagnosis not present

## 2019-02-25 DIAGNOSIS — G4700 Insomnia, unspecified: Secondary | ICD-10-CM | POA: Diagnosis not present

## 2019-03-19 DIAGNOSIS — H401131 Primary open-angle glaucoma, bilateral, mild stage: Secondary | ICD-10-CM | POA: Diagnosis not present

## 2019-06-11 DIAGNOSIS — E785 Hyperlipidemia, unspecified: Secondary | ICD-10-CM | POA: Diagnosis not present

## 2019-06-11 DIAGNOSIS — E7800 Pure hypercholesterolemia, unspecified: Secondary | ICD-10-CM | POA: Diagnosis not present

## 2019-06-11 DIAGNOSIS — E1140 Type 2 diabetes mellitus with diabetic neuropathy, unspecified: Secondary | ICD-10-CM | POA: Diagnosis not present

## 2019-06-11 DIAGNOSIS — E1165 Type 2 diabetes mellitus with hyperglycemia: Secondary | ICD-10-CM | POA: Diagnosis not present

## 2019-06-11 DIAGNOSIS — C50911 Malignant neoplasm of unspecified site of right female breast: Secondary | ICD-10-CM | POA: Diagnosis not present

## 2019-06-11 DIAGNOSIS — I10 Essential (primary) hypertension: Secondary | ICD-10-CM | POA: Diagnosis not present

## 2019-06-11 DIAGNOSIS — E782 Mixed hyperlipidemia: Secondary | ICD-10-CM | POA: Diagnosis not present

## 2019-06-11 DIAGNOSIS — E1342 Other specified diabetes mellitus with diabetic polyneuropathy: Secondary | ICD-10-CM | POA: Diagnosis not present

## 2019-07-26 DIAGNOSIS — E1165 Type 2 diabetes mellitus with hyperglycemia: Secondary | ICD-10-CM | POA: Diagnosis not present

## 2019-07-26 DIAGNOSIS — C50911 Malignant neoplasm of unspecified site of right female breast: Secondary | ICD-10-CM | POA: Diagnosis not present

## 2019-07-26 DIAGNOSIS — E785 Hyperlipidemia, unspecified: Secondary | ICD-10-CM | POA: Diagnosis not present

## 2019-07-26 DIAGNOSIS — E1140 Type 2 diabetes mellitus with diabetic neuropathy, unspecified: Secondary | ICD-10-CM | POA: Diagnosis not present

## 2019-07-26 DIAGNOSIS — E1342 Other specified diabetes mellitus with diabetic polyneuropathy: Secondary | ICD-10-CM | POA: Diagnosis not present

## 2019-07-26 DIAGNOSIS — E7800 Pure hypercholesterolemia, unspecified: Secondary | ICD-10-CM | POA: Diagnosis not present

## 2019-07-26 DIAGNOSIS — I10 Essential (primary) hypertension: Secondary | ICD-10-CM | POA: Diagnosis not present

## 2019-07-26 DIAGNOSIS — E782 Mixed hyperlipidemia: Secondary | ICD-10-CM | POA: Diagnosis not present

## 2019-09-20 DIAGNOSIS — E785 Hyperlipidemia, unspecified: Secondary | ICD-10-CM | POA: Diagnosis not present

## 2019-09-20 DIAGNOSIS — C50911 Malignant neoplasm of unspecified site of right female breast: Secondary | ICD-10-CM | POA: Diagnosis not present

## 2019-09-20 DIAGNOSIS — E782 Mixed hyperlipidemia: Secondary | ICD-10-CM | POA: Diagnosis not present

## 2019-09-20 DIAGNOSIS — I10 Essential (primary) hypertension: Secondary | ICD-10-CM | POA: Diagnosis not present

## 2019-09-20 DIAGNOSIS — E1165 Type 2 diabetes mellitus with hyperglycemia: Secondary | ICD-10-CM | POA: Diagnosis not present

## 2019-09-20 DIAGNOSIS — E1140 Type 2 diabetes mellitus with diabetic neuropathy, unspecified: Secondary | ICD-10-CM | POA: Diagnosis not present

## 2019-09-20 DIAGNOSIS — E7800 Pure hypercholesterolemia, unspecified: Secondary | ICD-10-CM | POA: Diagnosis not present

## 2019-10-15 ENCOUNTER — Ambulatory Visit: Payer: Medicare Other | Attending: Internal Medicine

## 2019-10-15 ENCOUNTER — Other Ambulatory Visit: Payer: Self-pay

## 2019-10-15 DIAGNOSIS — Z23 Encounter for immunization: Secondary | ICD-10-CM

## 2019-10-15 NOTE — Progress Notes (Signed)
   Covid-19 Vaccination Clinic  Name:  Sherri Garza    MRN: [id number] DOB: [birth year]  10/15/2019  [name] was observed post Covid-19 immunization for 30 minutes based on pre-vaccination screening without incident. She was provided with Vaccine Information Sheet and instruction to access the V-Safe system.   [name] was instructed to call 911 with any severe reactions post vaccine: Marland Kitchen Difficulty breathing  . Swelling of face and throat  . A fast heartbeat  . A bad rash all over body  . Dizziness and weakness   Immunizations Administered    Name Date Dose VIS Date Route   Pfizer COVID-19 Vaccine 10/15/2019 11:00 AM 0.3 mL 07/09/2019 Intramuscular   Manufacturer: [place]   Lot: [id number]   [place]: [id number]

## 2019-10-15 NOTE — Progress Notes (Signed)
   Covid-19 Vaccination Clinic  Name:  MILYN ZYLSTRA    MRN: [id number] DOB: 17-May-1941  10/15/2019  Ms. Denino was observed post Covid-19 immunization for 30 minutes based on pre-vaccination screening .  During the observation period, she experienced an adverse reaction with the following symptoms: patient reported feeling of Swelling of right eye ear.  Assessment : Time of assessment . Actions taken: Vitals signs taken @ 1120 BP 180/84, P-73, O2-99%, held in observation for another 15 mins, husband with her, she reports decrease in symptoms, she also she reports that she took homeopathic drops labeled, vaccine related symptom, she also reported experiencing " white coat syndrome', she reports that she takes nothing for BP.   There were no vitals filed for this visit.  Medications administered: No medication administered.  Disposition: Reports no further symptoms of adverse reaction after observation for 30  minutes. Discharged home.with husband   Immunizations Administered    Name Date Dose VIS Date Route   Pfizer COVID-19 Vaccine 10/15/2019 11:00 AM 0.3 mL 07/09/2019 Intramuscular   Manufacturer: [place]   Lot: [id number]   [place]: [id number]

## 2019-10-22 ENCOUNTER — Telehealth: Payer: Self-pay | Admitting: *Deleted

## 2019-10-22 NOTE — Telephone Encounter (Signed)
An attempt has been made to contact Sherri Garza in regard to her COVID vaccine that she received on 10/15/2019 to discuss any further symptoms or issues that she may have experienced post vaccine. Numerous attempts have been made to call the patient's only listed home number at [phone] and received a quick ring and the line went dead. Will need to attempt to call again.

## 2019-10-26 NOTE — Telephone Encounter (Signed)
Made another attempt to call the patient at her phone number, it rang once and then the phone went dead. Unfortunately was unable to reach the patient and was unable to leave a voicemail.

## 2019-11-02 DIAGNOSIS — H401131 Primary open-angle glaucoma, bilateral, mild stage: Secondary | ICD-10-CM | POA: Diagnosis not present

## 2019-11-04 ENCOUNTER — Telehealth: Payer: Self-pay | Admitting: *Deleted

## 2019-11-04 NOTE — Telephone Encounter (Signed)
It likely is not the case that the vaccine triggered any Bell's palsy-like reaction as it appears the swelling symptoms quickly resolved with the medication that she took.  This would not be the case if it were a true Bell's palsy type reaction.   Can she give [place] the name of the homeopathic medicine that she has for after vaccines?  If it is like an antihistamine then it may be wise that she take that medication before she gets her second dose of the Pfizer vaccine to help prevent any symptoms related to the vaccine.  However this will all depend on what this medication actually is. Given her reported symptom of eye swelling that resolved with the medication I do not think she needs to have any testing performed prior to her next vaccine.  However I would recommend pretreating her vaccine with an antihistamine prior to and to help reduce the risk of symptoms.  And this will all depend on what the homeopathic medication is.

## 2019-11-04 NOTE — Telephone Encounter (Signed)
Patient called back in regards to the reaction had to the COVID vaccine. She states that she had swelling in her left eye that quickly radiated to her ear. She received the first dose of the Pfizer vaccine. 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 She states that she does not do modern medicine too much due to reactions to other medications in the past and she lives a homeopathic lifestyle. When the swelling started she took some homeopathic medicine that she has for after vaccines. She stated then that the swelling quickly went away. She states that she did not have any other issues after that other than some body aches. Advised to patient that will speak with a physician to see if there any recommendations. Patient advised that she may not take advice if it does involve taking medications that are not homeopathic. Please advise.

## 2019-11-05 NOTE — Telephone Encounter (Signed)
Ok

## 2019-11-05 NOTE — Telephone Encounter (Signed)
Spoke with patient and she informed me that the homeopathic medicine is called [place] homeopathic's post vaccination. The Product Info: Active Ingredients: Equal parts of Apis mel. 6x, Echinacea 6x, Hypericum 6x, Aconitum nap. 15x, Alumina 15x, Antimon. tart. 15x, Arsenicum alb. 15x, Baptisia 15x, Belladonna 15x, Bryonia 15x, Cinchona 15x, Crotalus hor. 15x, Gelsemium 15x, Kali bic. 15x, Lachesis 15x, Ledum 15x, Merc. viv. 15x, Mezereum 15x, Nux vom. 15x, Phos. 15x, Pulsatilla 15x, Rhus tox. 123456, Silicea 123456, Sulphur 123456, Thuja occ. 15x, Vaccinium 15x, Influenzinum 30x, Morbillinum 30x, Pertussinum 30x. Inactive Ingredients: USP Purified water; USP Gluten-free, non-GMO, organic cane alcohol 20%. Patient stated that she understand what we are trying to do and help her however patient informed me that she in fine. Patient stated she does not pre medicate and that she only treats what is going on. Patient stated that she will speak with her homeopathic mentor and see what she advises. She stated that she will get the second injection and take the post vaccination medicine after if she has any problems.

## 2019-11-08 NOTE — Telephone Encounter (Signed)
Patient called back to let Dr. Nelva Bush know that her homeopathic doctor advised her that it would be okay for her to take her post vaccination supplement before and after her vaccine. She wanted to make you aware that she will do this and feels that she will be fine.

## 2019-11-09 NOTE — Telephone Encounter (Signed)
Thanks for update

## 2019-11-10 ENCOUNTER — Ambulatory Visit: Payer: Medicare Other | Attending: Internal Medicine

## 2019-11-10 DIAGNOSIS — Z23 Encounter for immunization: Secondary | ICD-10-CM

## 2019-11-10 NOTE — Progress Notes (Signed)
   Covid-19 Vaccination Clinic  Name:  Sherri Garza    MRN: [id number] DOB: Jan 19, 1941  11/10/2019  [name] was observed post Covid-19 immunization for 30 minutes based on pre-vaccination screening without incident. She was provided with Vaccine Information Sheet and instruction to access the V-Safe system.   [name] was instructed to call 911 with any severe reactions post vaccine: Marland Kitchen Difficulty breathing  . Swelling of face and throat  . A fast heartbeat  . A bad rash all over body  . Dizziness and weakness   Immunizations Administered    Name Date Dose VIS Date Route   Pfizer COVID-19 Vaccine 11/10/2019  1:03 PM 0.3 mL 07/09/2019 Intramuscular   Manufacturer: [place]   Lot: [id number]   [place]: [id number]

## 2019-11-22 DIAGNOSIS — H401131 Primary open-angle glaucoma, bilateral, mild stage: Secondary | ICD-10-CM | POA: Diagnosis not present

## 2019-11-24 DIAGNOSIS — I739 Peripheral vascular disease, unspecified: Secondary | ICD-10-CM | POA: Diagnosis not present

## 2019-11-24 DIAGNOSIS — R252 Cramp and spasm: Secondary | ICD-10-CM | POA: Diagnosis not present

## 2019-11-24 DIAGNOSIS — Z79899 Other long term (current) drug therapy: Secondary | ICD-10-CM | POA: Diagnosis not present

## 2019-11-24 DIAGNOSIS — Z1389 Encounter for screening for other disorder: Secondary | ICD-10-CM | POA: Diagnosis not present

## 2019-11-24 DIAGNOSIS — E7800 Pure hypercholesterolemia, unspecified: Secondary | ICD-10-CM | POA: Diagnosis not present

## 2019-11-24 DIAGNOSIS — E559 Vitamin D deficiency, unspecified: Secondary | ICD-10-CM | POA: Diagnosis not present

## 2019-11-24 DIAGNOSIS — F4310 Post-traumatic stress disorder, unspecified: Secondary | ICD-10-CM | POA: Diagnosis not present

## 2019-11-24 DIAGNOSIS — Z7984 Long term (current) use of oral hypoglycemic drugs: Secondary | ICD-10-CM | POA: Diagnosis not present

## 2019-11-24 DIAGNOSIS — E1342 Other specified diabetes mellitus with diabetic polyneuropathy: Secondary | ICD-10-CM | POA: Diagnosis not present

## 2019-11-24 DIAGNOSIS — E1165 Type 2 diabetes mellitus with hyperglycemia: Secondary | ICD-10-CM | POA: Diagnosis not present

## 2019-11-24 DIAGNOSIS — I10 Essential (primary) hypertension: Secondary | ICD-10-CM | POA: Diagnosis not present

## 2019-11-24 DIAGNOSIS — Z0000 Encounter for general adult medical examination without abnormal findings: Secondary | ICD-10-CM | POA: Diagnosis not present

## 2020-04-10 DIAGNOSIS — E1165 Type 2 diabetes mellitus with hyperglycemia: Secondary | ICD-10-CM | POA: Diagnosis not present

## 2020-04-10 DIAGNOSIS — E7800 Pure hypercholesterolemia, unspecified: Secondary | ICD-10-CM | POA: Diagnosis not present

## 2020-04-10 DIAGNOSIS — Z7984 Long term (current) use of oral hypoglycemic drugs: Secondary | ICD-10-CM | POA: Diagnosis not present

## 2020-04-10 DIAGNOSIS — I10 Essential (primary) hypertension: Secondary | ICD-10-CM | POA: Diagnosis not present

## 2020-04-10 DIAGNOSIS — E559 Vitamin D deficiency, unspecified: Secondary | ICD-10-CM | POA: Diagnosis not present

## 2020-04-10 DIAGNOSIS — E1342 Other specified diabetes mellitus with diabetic polyneuropathy: Secondary | ICD-10-CM | POA: Diagnosis not present

## 2020-04-10 DIAGNOSIS — R350 Frequency of micturition: Secondary | ICD-10-CM | POA: Diagnosis not present

## 2020-04-10 DIAGNOSIS — F4310 Post-traumatic stress disorder, unspecified: Secondary | ICD-10-CM | POA: Diagnosis not present

## 2020-04-10 DIAGNOSIS — N390 Urinary tract infection, site not specified: Secondary | ICD-10-CM | POA: Diagnosis not present

## 2020-04-25 DIAGNOSIS — M7918 Myalgia, other site: Secondary | ICD-10-CM | POA: Diagnosis not present

## 2020-04-25 DIAGNOSIS — E1140 Type 2 diabetes mellitus with diabetic neuropathy, unspecified: Secondary | ICD-10-CM | POA: Diagnosis not present

## 2020-04-25 DIAGNOSIS — R300 Dysuria: Secondary | ICD-10-CM | POA: Diagnosis not present

## 2020-05-01 DIAGNOSIS — I10 Essential (primary) hypertension: Secondary | ICD-10-CM | POA: Diagnosis not present

## 2020-05-01 DIAGNOSIS — Z7984 Long term (current) use of oral hypoglycemic drugs: Secondary | ICD-10-CM | POA: Diagnosis not present

## 2020-05-01 DIAGNOSIS — E1165 Type 2 diabetes mellitus with hyperglycemia: Secondary | ICD-10-CM | POA: Diagnosis not present

## 2020-05-01 DIAGNOSIS — E871 Hypo-osmolality and hyponatremia: Secondary | ICD-10-CM | POA: Diagnosis not present

## 2020-05-01 DIAGNOSIS — R300 Dysuria: Secondary | ICD-10-CM | POA: Diagnosis not present

## 2020-05-01 DIAGNOSIS — M7918 Myalgia, other site: Secondary | ICD-10-CM | POA: Diagnosis not present

## 2020-05-01 DIAGNOSIS — E1140 Type 2 diabetes mellitus with diabetic neuropathy, unspecified: Secondary | ICD-10-CM | POA: Diagnosis not present

## 2020-05-15 DIAGNOSIS — E871 Hypo-osmolality and hyponatremia: Secondary | ICD-10-CM | POA: Diagnosis not present

## 2020-05-15 DIAGNOSIS — I10 Essential (primary) hypertension: Secondary | ICD-10-CM | POA: Diagnosis not present

## 2020-05-15 DIAGNOSIS — E1140 Type 2 diabetes mellitus with diabetic neuropathy, unspecified: Secondary | ICD-10-CM | POA: Diagnosis not present

## 2020-05-15 DIAGNOSIS — E1165 Type 2 diabetes mellitus with hyperglycemia: Secondary | ICD-10-CM | POA: Diagnosis not present

## 2020-05-30 DIAGNOSIS — E1165 Type 2 diabetes mellitus with hyperglycemia: Secondary | ICD-10-CM | POA: Diagnosis not present

## 2020-05-30 DIAGNOSIS — I10 Essential (primary) hypertension: Secondary | ICD-10-CM | POA: Diagnosis not present

## 2020-05-30 DIAGNOSIS — E871 Hypo-osmolality and hyponatremia: Secondary | ICD-10-CM | POA: Diagnosis not present

## 2020-05-30 DIAGNOSIS — M25561 Pain in right knee: Secondary | ICD-10-CM | POA: Diagnosis not present

## 2020-05-30 DIAGNOSIS — E1140 Type 2 diabetes mellitus with diabetic neuropathy, unspecified: Secondary | ICD-10-CM | POA: Diagnosis not present

## 2020-06-09 DIAGNOSIS — E113293 Type 2 diabetes mellitus with mild nonproliferative diabetic retinopathy without macular edema, bilateral: Secondary | ICD-10-CM | POA: Diagnosis not present

## 2020-07-24 ENCOUNTER — Ambulatory Visit: Payer: Self-pay

## 2020-07-31 ENCOUNTER — Ambulatory Visit: Payer: Medicare Other | Attending: Internal Medicine

## 2020-07-31 DIAGNOSIS — Z23 Encounter for immunization: Secondary | ICD-10-CM

## 2020-07-31 NOTE — Progress Notes (Signed)
   Covid-19 Vaccination Clinic  Name:  Sherri Garza    MRN: [id number] DOB: 1941/03/12  07/31/2020  Sherri Garza was observed post Covid-19 immunization for 15 minutes without incident. She was provided with Vaccine Information Sheet and instruction to access the V-Safe system.   Sherri Garza was instructed to call 911 with any severe reactions post vaccine: Marland Kitchen Difficulty breathing  . Swelling of face and throat  . A fast heartbeat  . A bad rash all over body  . Dizziness and weakness   Immunizations Administered    Name Date Dose VIS Date Route   Pfizer COVID-19 Vaccine 07/31/2020  3:12 PM 0.3 mL 05/17/2020 Intramuscular   Manufacturer: [employer]   Lot: [id number]   [place]: [address]-4

## 2020-08-08 ENCOUNTER — Ambulatory Visit: Payer: Medicare Other

## 2020-08-23 DIAGNOSIS — E871 Hypo-osmolality and hyponatremia: Secondary | ICD-10-CM | POA: Diagnosis not present

## 2020-08-23 DIAGNOSIS — M25561 Pain in right knee: Secondary | ICD-10-CM | POA: Diagnosis not present

## 2020-08-23 DIAGNOSIS — E1140 Type 2 diabetes mellitus with diabetic neuropathy, unspecified: Secondary | ICD-10-CM | POA: Diagnosis not present

## 2020-08-23 DIAGNOSIS — E1165 Type 2 diabetes mellitus with hyperglycemia: Secondary | ICD-10-CM | POA: Diagnosis not present

## 2020-08-23 DIAGNOSIS — I10 Essential (primary) hypertension: Secondary | ICD-10-CM | POA: Diagnosis not present

## 2020-11-27 DIAGNOSIS — Z7984 Long term (current) use of oral hypoglycemic drugs: Secondary | ICD-10-CM | POA: Diagnosis not present

## 2020-11-27 DIAGNOSIS — Z1389 Encounter for screening for other disorder: Secondary | ICD-10-CM | POA: Diagnosis not present

## 2020-11-27 DIAGNOSIS — E871 Hypo-osmolality and hyponatremia: Secondary | ICD-10-CM | POA: Diagnosis not present

## 2020-11-27 DIAGNOSIS — E559 Vitamin D deficiency, unspecified: Secondary | ICD-10-CM | POA: Diagnosis not present

## 2020-11-27 DIAGNOSIS — I10 Essential (primary) hypertension: Secondary | ICD-10-CM | POA: Diagnosis not present

## 2020-11-27 DIAGNOSIS — E1140 Type 2 diabetes mellitus with diabetic neuropathy, unspecified: Secondary | ICD-10-CM | POA: Diagnosis not present

## 2020-11-27 DIAGNOSIS — Z0000 Encounter for general adult medical examination without abnormal findings: Secondary | ICD-10-CM | POA: Diagnosis not present

## 2020-11-27 DIAGNOSIS — M25561 Pain in right knee: Secondary | ICD-10-CM | POA: Diagnosis not present

## 2020-11-27 DIAGNOSIS — E1165 Type 2 diabetes mellitus with hyperglycemia: Secondary | ICD-10-CM | POA: Diagnosis not present

## 2020-11-27 DIAGNOSIS — Z79899 Other long term (current) drug therapy: Secondary | ICD-10-CM | POA: Diagnosis not present

## 2020-12-18 DIAGNOSIS — H401131 Primary open-angle glaucoma, bilateral, mild stage: Secondary | ICD-10-CM | POA: Diagnosis not present

## 2020-12-26 DIAGNOSIS — H401131 Primary open-angle glaucoma, bilateral, mild stage: Secondary | ICD-10-CM | POA: Diagnosis not present

## 2021-05-17 ENCOUNTER — Other Ambulatory Visit: Payer: Self-pay

## 2021-05-17 ENCOUNTER — Ambulatory Visit: Payer: Medicare Other | Attending: Internal Medicine

## 2021-05-17 DIAGNOSIS — Z23 Encounter for immunization: Secondary | ICD-10-CM

## 2021-05-17 MED ORDER — PFIZER COVID-19 VAC BIVALENT 30 MCG/0.3ML IM SUSP
INTRAMUSCULAR | 0 refills | Status: DC
  Filled 2021-05-17: qty 0.3, 1d supply, fill #0

## 2021-05-17 NOTE — Progress Notes (Signed)
   Covid-19 Vaccination Clinic  Name:  ANNETH BRUNELL    MRN: [id number] DOB: [birth year]  05/17/2021  Ms. Susan was observed post Covid-19 immunization for 15 minutes without incident. She was provided with Vaccine Information Sheet and instruction to access the V-Safe system.   Ms. Mccreery was instructed to call 911 with any severe reactions post vaccine: Difficulty breathing  Swelling of face and throat  A fast heartbeat  A bad rash all over body  Dizziness and weakness   Immunizations Administered     Name Date Dose VIS Date Route   Pfizer Covid-19 Vaccine Bivalent Booster 05/17/2021  1:30 PM 0.3 mL 03/28/2021 Intramuscular   Manufacturer: [place]   Lot: [place]: [phone]       Covid-19 Vaccination Clinic  Name:  NAREH MATZKE    MRN: [id number] DOB: [birth year]  05/17/2021  Ms. Alvidrez was observed post Covid-19 immunization for 15 minutes without incident. She was provided with Vaccine Information Sheet and instruction to access the V-Safe system.   Ms. Marcella was instructed to call 911 with any severe reactions post vaccine: Difficulty breathing  Swelling of face and throat  A fast heartbeat  A bad rash all over body  Dizziness and weakness   Immunizations Administered     Name Date Dose VIS Date Route   Pfizer Covid-19 Vaccine Bivalent Booster 05/17/2021  1:30 PM 0.3 mL 03/28/2021 Intramuscular   Manufacturer: [place]   Lot: [id number]   [place]: [id number]

## 2021-06-29 DIAGNOSIS — Z7984 Long term (current) use of oral hypoglycemic drugs: Secondary | ICD-10-CM | POA: Diagnosis not present

## 2021-06-29 DIAGNOSIS — M25561 Pain in right knee: Secondary | ICD-10-CM | POA: Diagnosis not present

## 2021-06-29 DIAGNOSIS — R519 Headache, unspecified: Secondary | ICD-10-CM | POA: Diagnosis not present

## 2021-06-29 DIAGNOSIS — I10 Essential (primary) hypertension: Secondary | ICD-10-CM | POA: Diagnosis not present

## 2021-06-29 DIAGNOSIS — E1140 Type 2 diabetes mellitus with diabetic neuropathy, unspecified: Secondary | ICD-10-CM | POA: Diagnosis not present

## 2021-06-29 DIAGNOSIS — E559 Vitamin D deficiency, unspecified: Secondary | ICD-10-CM | POA: Diagnosis not present

## 2021-06-29 DIAGNOSIS — E1165 Type 2 diabetes mellitus with hyperglycemia: Secondary | ICD-10-CM | POA: Diagnosis not present

## 2021-06-29 DIAGNOSIS — E871 Hypo-osmolality and hyponatremia: Secondary | ICD-10-CM | POA: Diagnosis not present

## 2021-08-01 ENCOUNTER — Other Ambulatory Visit (HOSPITAL_COMMUNITY): Payer: Self-pay

## 2021-08-02 DIAGNOSIS — I10 Essential (primary) hypertension: Secondary | ICD-10-CM | POA: Diagnosis not present

## 2021-08-02 DIAGNOSIS — Z7984 Long term (current) use of oral hypoglycemic drugs: Secondary | ICD-10-CM | POA: Diagnosis not present

## 2021-08-02 DIAGNOSIS — E1165 Type 2 diabetes mellitus with hyperglycemia: Secondary | ICD-10-CM | POA: Diagnosis not present

## 2021-08-02 DIAGNOSIS — R54 Age-related physical debility: Secondary | ICD-10-CM | POA: Diagnosis not present

## 2021-09-24 DIAGNOSIS — E1140 Type 2 diabetes mellitus with diabetic neuropathy, unspecified: Secondary | ICD-10-CM | POA: Diagnosis not present

## 2021-09-24 DIAGNOSIS — I10 Essential (primary) hypertension: Secondary | ICD-10-CM | POA: Diagnosis not present

## 2021-09-24 DIAGNOSIS — E1165 Type 2 diabetes mellitus with hyperglycemia: Secondary | ICD-10-CM | POA: Diagnosis not present

## 2021-09-24 DIAGNOSIS — E559 Vitamin D deficiency, unspecified: Secondary | ICD-10-CM | POA: Diagnosis not present

## 2021-11-14 ENCOUNTER — Ambulatory Visit
Admission: RE | Admit: 2021-11-14 | Discharge: 2021-11-14 | Disposition: A | Discharge status: Home / Self Care | Payer: Medicare Other | Source: Ambulatory Visit | Attending: Internal Medicine | Admitting: Internal Medicine

## 2021-11-14 ENCOUNTER — Other Ambulatory Visit: Payer: Self-pay | Admitting: Internal Medicine

## 2021-11-14 DIAGNOSIS — R0789 Other chest pain: Secondary | ICD-10-CM

## 2021-11-16 ENCOUNTER — Other Ambulatory Visit: Payer: Self-pay | Admitting: Internal Medicine

## 2021-11-16 DIAGNOSIS — N63 Unspecified lump in unspecified breast: Secondary | ICD-10-CM

## 2021-11-19 ENCOUNTER — Ambulatory Visit
Admission: RE | Admit: 2021-11-19 | Discharge: 2021-11-19 | Disposition: A | Discharge status: Home / Self Care | Payer: Medicare Other | Source: Ambulatory Visit | Attending: Internal Medicine | Admitting: Internal Medicine

## 2021-11-19 ENCOUNTER — Other Ambulatory Visit: Payer: Self-pay | Admitting: Internal Medicine

## 2021-11-19 DIAGNOSIS — N6489 Other specified disorders of breast: Secondary | ICD-10-CM | POA: Diagnosis not present

## 2021-11-19 DIAGNOSIS — Z1239 Encounter for other screening for malignant neoplasm of breast: Secondary | ICD-10-CM

## 2021-11-19 DIAGNOSIS — N63 Unspecified lump in unspecified breast: Secondary | ICD-10-CM

## 2021-11-19 DIAGNOSIS — Z1231 Encounter for screening mammogram for malignant neoplasm of breast: Secondary | ICD-10-CM | POA: Diagnosis not present

## 2021-11-28 DIAGNOSIS — E559 Vitamin D deficiency, unspecified: Secondary | ICD-10-CM | POA: Diagnosis not present

## 2021-11-28 DIAGNOSIS — Z0000 Encounter for general adult medical examination without abnormal findings: Secondary | ICD-10-CM | POA: Diagnosis not present

## 2021-11-28 DIAGNOSIS — M25561 Pain in right knee: Secondary | ICD-10-CM | POA: Diagnosis not present

## 2021-11-28 DIAGNOSIS — E1165 Type 2 diabetes mellitus with hyperglycemia: Secondary | ICD-10-CM | POA: Diagnosis not present

## 2021-11-28 DIAGNOSIS — I10 Essential (primary) hypertension: Secondary | ICD-10-CM | POA: Diagnosis not present

## 2021-11-28 DIAGNOSIS — Z1331 Encounter for screening for depression: Secondary | ICD-10-CM | POA: Diagnosis not present

## 2021-11-28 DIAGNOSIS — Z79899 Other long term (current) drug therapy: Secondary | ICD-10-CM | POA: Diagnosis not present

## 2021-11-28 DIAGNOSIS — I739 Peripheral vascular disease, unspecified: Secondary | ICD-10-CM | POA: Diagnosis not present

## 2021-11-28 DIAGNOSIS — E1140 Type 2 diabetes mellitus with diabetic neuropathy, unspecified: Secondary | ICD-10-CM | POA: Diagnosis not present

## 2021-11-28 DIAGNOSIS — E871 Hypo-osmolality and hyponatremia: Secondary | ICD-10-CM | POA: Diagnosis not present

## 2022-01-07 DIAGNOSIS — H401131 Primary open-angle glaucoma, bilateral, mild stage: Secondary | ICD-10-CM | POA: Diagnosis not present

## 2022-04-02 DIAGNOSIS — I739 Peripheral vascular disease, unspecified: Secondary | ICD-10-CM | POA: Diagnosis not present

## 2022-04-02 DIAGNOSIS — E785 Hyperlipidemia, unspecified: Secondary | ICD-10-CM | POA: Diagnosis not present

## 2022-04-02 DIAGNOSIS — I10 Essential (primary) hypertension: Secondary | ICD-10-CM | POA: Diagnosis not present

## 2022-04-02 DIAGNOSIS — Z1239 Encounter for other screening for malignant neoplasm of breast: Secondary | ICD-10-CM | POA: Diagnosis not present

## 2022-04-02 DIAGNOSIS — Z79899 Other long term (current) drug therapy: Secondary | ICD-10-CM | POA: Diagnosis not present

## 2022-04-02 DIAGNOSIS — E871 Hypo-osmolality and hyponatremia: Secondary | ICD-10-CM | POA: Diagnosis not present

## 2022-04-02 DIAGNOSIS — E1140 Type 2 diabetes mellitus with diabetic neuropathy, unspecified: Secondary | ICD-10-CM | POA: Diagnosis not present

## 2022-04-11 DIAGNOSIS — E1140 Type 2 diabetes mellitus with diabetic neuropathy, unspecified: Secondary | ICD-10-CM | POA: Diagnosis not present

## 2022-04-11 DIAGNOSIS — M549 Dorsalgia, unspecified: Secondary | ICD-10-CM | POA: Diagnosis not present

## 2022-04-11 DIAGNOSIS — R519 Headache, unspecified: Secondary | ICD-10-CM | POA: Diagnosis not present

## 2022-04-11 DIAGNOSIS — Z23 Encounter for immunization: Secondary | ICD-10-CM | POA: Diagnosis not present

## 2022-04-11 DIAGNOSIS — F22 Delusional disorders: Secondary | ICD-10-CM | POA: Diagnosis not present

## 2022-04-11 DIAGNOSIS — R413 Other amnesia: Secondary | ICD-10-CM | POA: Diagnosis not present

## 2022-05-27 ENCOUNTER — Other Ambulatory Visit (HOSPITAL_COMMUNITY): Payer: Self-pay

## 2022-05-30 DIAGNOSIS — R413 Other amnesia: Secondary | ICD-10-CM | POA: Diagnosis not present

## 2022-05-30 DIAGNOSIS — E875 Hyperkalemia: Secondary | ICD-10-CM | POA: Diagnosis not present

## 2022-05-30 DIAGNOSIS — F22 Delusional disorders: Secondary | ICD-10-CM | POA: Diagnosis not present

## 2022-05-30 DIAGNOSIS — R519 Headache, unspecified: Secondary | ICD-10-CM | POA: Diagnosis not present

## 2022-05-30 DIAGNOSIS — E1165 Type 2 diabetes mellitus with hyperglycemia: Secondary | ICD-10-CM | POA: Diagnosis not present

## 2022-05-30 DIAGNOSIS — N179 Acute kidney failure, unspecified: Secondary | ICD-10-CM | POA: Diagnosis not present

## 2022-06-03 ENCOUNTER — Other Ambulatory Visit: Payer: Self-pay | Admitting: Internal Medicine

## 2022-06-03 DIAGNOSIS — R519 Headache, unspecified: Secondary | ICD-10-CM

## 2022-06-12 DIAGNOSIS — F22 Delusional disorders: Secondary | ICD-10-CM | POA: Diagnosis not present

## 2022-06-12 DIAGNOSIS — E875 Hyperkalemia: Secondary | ICD-10-CM | POA: Diagnosis not present

## 2022-06-12 DIAGNOSIS — G43909 Migraine, unspecified, not intractable, without status migrainosus: Secondary | ICD-10-CM | POA: Diagnosis not present

## 2022-06-14 DIAGNOSIS — E875 Hyperkalemia: Secondary | ICD-10-CM | POA: Diagnosis not present

## 2022-07-02 ENCOUNTER — Encounter: Payer: Self-pay | Admitting: Neurology

## 2022-07-02 ENCOUNTER — Other Ambulatory Visit: Payer: Medicare Other

## 2022-07-02 NOTE — Progress Notes (Unsigned)
[facility]  PATIENT: Sherri Garza DOB: 1941-07-02  REFERRING CLINICIAN: Kathalene Frames, * HISTORY FROM: self, husband Fritz Pickerel REASON FOR VISIT: memory loss   HISTORICAL  CHIEF COMPLAINT:  Chief Complaint  Patient presents with   Memory Loss    RM 8 with husband Fritz Pickerel  Pt is well, spouse states she has been having memory changes for about 2 yrs.  Having difficulty with names, people, places. She is able to do ADL's     HISTORY OF PRESENT ILLNESS:  The patient presents for evaluation of memory loss which has been present over the past 4 years. Husband states this started after a dental X-ray. For 2 years after this she would wake up in the middle of the night screaming that the dentist was trying to kill her. She would be inconsolable for 1-2 hours each night. This did improve over time but her memory continued to decline, especially over the past year. She has started forgetting names and dates. She cannot remember her cats' names.  She has to write multiple notes to remember things and is misplacing objects all over the house. Forgets conversations the day after she has them. She does continue to get more confused at night, so she was prescribed aripiprazole by her PCP. This helped with the confusion but she continued to wander around the house at night. She was then prescribed Seroquel, which has been more effective and helped her sleep through the night. She is in denial about her memory loss, stating she chooses to forget things because she does not want to remember bad memories.  She has also developed daily severe right-sided headaches over the past couple of years. She was prescribed indomethacin for headaches, but this was discontinued as it was reportedly affecting her blood counts.   TBI:  No past history of TBI Stroke:  no past history of stroke Seizures:  no past history of seizures Sleep: She sleeps well at bedtime. She does have increased  confusion in the evening, which has improved with Seroquel Mood: She has become socially isolated since the pandemic. Husband notes she has been more irritable and will say angry things, then will act as if they never fought.  Functional status: Patient lives with her husband Cooking: cooks without issues Shopping: husband does the shopping Driving: She doesn't drive Bills: Husband manages finances,  Medications: Husband helps manage her medications because her blood sugar was getting out of control Ever left the stove on by accident?: no Forget how to use items around the house?: no Getting lost going to familiar places?: no Forgetting loved ones names?: yes Word finding difficulty? none  OTHER MEDICAL CONDITIONS: DM2, glaucoma, vitamin D deficiency   REVIEW OF SYSTEMS: Full 14 system review of systems performed and negative with exception of: memory loss  ALLERGIES: Allergies  Allergen Reactions   Prevagen [Apoaequorin] Anaphylaxis   Bacitracin    Ciprofloxacin    Doxycycline    Januvia [Sitagliptin]    Metformin And Related Diarrhea   Penicillins     HOME MEDICATIONS: Outpatient Medications Prior to Visit  Medication Sig Dispense Refill   glipiZIDE (GLUCOTROL XL) 5 MG 24 hr tablet Take 5 mg by mouth daily with breakfast.     Krill Oil (OMEGA-3) 500 MG CAPS Take 500 mg by mouth daily.     Magnesium 300 MG CAPS Take 300 mg by mouth daily.     Multiple Vitamin (MULTIVITAMIN) tablet Take 1 tablet by mouth daily.  sitaGLIPtin-metformin (JANUMET) 50-1000 MG tablet Take 1 tablet by mouth 2 (two) times daily with a meal.     QUEtiapine (SEROQUEL) 25 MG tablet Take 25 mg by mouth at bedtime.     COVID-19 mRNA bivalent vaccine, Pfizer, (PFIZER COVID-19 VAC BIVALENT) injection Inject into the muscle. (Patient not taking: Reported on 07/03/2022) 0.3 mL 0   No facility-administered medications prior to visit.    PAST MEDICAL HISTORY: Past Medical History:  Diagnosis Date    Bell's palsy    2007 and 2019 brought on by stress   Cancer [facility] 1999   Right breast cancer   Depression    Glaucoma    IBS (irritable bowel syndrome)    Memory loss    Vitamin D deficiency     PAST SURGICAL HISTORY: Past Surgical History:  Procedure Laterality Date   APPENDECTOMY     LAPAROSCOPIC OVARIAN CYSTECTOMY     MASTECTOMY Right 1999    FAMILY HISTORY: History reviewed. No pertinent family history.  SOCIAL HISTORY: Social History   Socioeconomic History   Marital status: Married    Spouse name: Not on file   Number of children: Not on file   Years of education: Not on file   Highest education level: Not on file  Occupational History   Not on file  Tobacco Use   Smoking status: Not on file   Smokeless tobacco: Not on file  Substance and Sexual Activity   Alcohol use: Not on file   Drug use: Not on file   Sexual activity: Not on file  Other Topics Concern   Not on file  Social History Narrative   Not on file   Social Determinants of Health   Financial Resource Strain: Not on file  Food Insecurity: Not on file  Transportation Needs: Not on file  Physical Activity: Not on file  Stress: Not on file  Social Connections: Not on file  Intimate Partner Violence: Not on file     PHYSICAL EXAM  GENERAL EXAM/CONSTITUTIONAL: Vitals:  Vitals:   07/03/22 1343  BP: (!) 160/81  Pulse: 65  Weight: 142 lb (64.4 kg)  Height: 5' (1.524 m)   Body mass index is 27.73 kg/m. Wt Readings from Last 3 Encounters:  07/03/22 142 lb (64.4 kg)    NEUROLOGIC: MENTAL STATUS:     07/03/2022    1:50 PM  Montreal Cognitive Assessment   Visuospatial/ Executive (0/5) 0  Naming (0/3) 3  Attention: Read list of digits (0/2) 1  Attention: Read list of letters (0/1) 1  Attention: Serial 7 subtraction starting at 100 (0/3) 0  Language: Repeat phrase (0/2) 0  Language : Fluency (0/1) 0  Abstraction (0/2) 0  Delayed Recall (0/5) 0  Orientation (0/6) 0  Total 5     CRANIAL NERVE:  2nd, 3rd, 4th, 6th - pupils equal and reactive to light, visual fields full to confrontation, extraocular muscles intact, no nystagmus 5th - facial sensation symmetric 7th - facial strength symmetric 8th - hearing intact 9th - palate elevates symmetrically, uvula midline 11th - shoulder shrug symmetric 12th - tongue protrusion midline  MOTOR:  normal bulk and tone, no cogwheeling, full strength in the BUE, BLE  SENSORY:  normal and symmetric to light touch all 4 extremities  COORDINATION:  finger-nose-finger, fine finger movements normal, mild postural tremor bilateral upper extremities  REFLEXES:  deep tendon reflexes present and symmetric  GAIT/STATION:  normal     DIAGNOSTIC DATA (LABS, IMAGING, TESTING) - I reviewed  patient records, labs, notes, testing and imaging myself where available.  05/30/22: B12 301, TSH 2.32    ASSESSMENT AND PLAN  81 y.o. year old female with a history of DM2, glaucoma, vitamin D deficiency who presents for evaluation of progressive memory loss and headaches. Her MOCA score today is 5/30. Will order brain MRI to rule out neoplasm and to assess for signs of neurodegeneration. Discussed potentially starting a medication for dementia pending MRI results. As of now the patient is resistant to starting a medication for her memory. Will continue Seroquel for nighttime confusion and agitation.   1. Memory loss       PLAN: - MRI brain  - Continue Seroquel 25 mg QHS - Consider starting donepezil pending MRI results  Orders Placed This Encounter  Procedures   MR BRAIN WO CONTRAST    Meds ordered this encounter  Medications   QUEtiapine (SEROQUEL) 25 MG tablet    Sig: Take 1 tablet (25 mg total) by mouth at bedtime.    Dispense:  30 tablet    Refill:  5    Return in about 6 months (around 01/02/2023).  I spent an average of 48 minutes chart reviewing and counseling the patient, with at least 50% of the time face to  face with the patient.   Genia Harold, MD 07/03/22 3:40 PM  [facility] [address] [place], [place] [address] [phone]

## 2022-07-03 ENCOUNTER — Encounter: Payer: Self-pay | Admitting: Psychiatry

## 2022-07-03 ENCOUNTER — Ambulatory Visit (INDEPENDENT_AMBULATORY_CARE_PROVIDER_SITE_OTHER): Payer: Medicare Other | Admitting: Psychiatry

## 2022-07-03 ENCOUNTER — Telehealth: Payer: Self-pay | Admitting: Psychiatry

## 2022-07-03 VITALS — BP 160/81 | HR 65 | Ht 60.0 in | Wt 142.0 lb

## 2022-07-03 DIAGNOSIS — R413 Other amnesia: Secondary | ICD-10-CM

## 2022-07-03 MED ORDER — QUETIAPINE FUMARATE 25 MG PO TABS: 25.0000 mg | ORAL_TABLET | Freq: Every day | ORAL | 5 refills | Status: DC

## 2022-07-03 NOTE — Telephone Encounter (Signed)
Sent to [facility] for open MRI, no auth required.

## 2022-07-12 DIAGNOSIS — H401131 Primary open-angle glaucoma, bilateral, mild stage: Secondary | ICD-10-CM | POA: Diagnosis not present

## 2022-09-19 ENCOUNTER — Telehealth: Payer: Self-pay | Admitting: Psychiatry

## 2022-09-19 NOTE — Telephone Encounter (Signed)
Pt's husband called stating that he is needing to speak to the MD regarding some issues he is having with the pt and having a hard time getting the pt to the MRI that was ordered for her. Please advise.

## 2022-09-19 NOTE — Telephone Encounter (Signed)
Left message for pt husband Fritz Pickerel to call.

## 2022-09-25 NOTE — Telephone Encounter (Signed)
Pt's husband has called Terrence Dupont, RN back.  Please call spouse back

## 2022-09-25 NOTE — Telephone Encounter (Signed)
Tried calling husband back again, goes straight to VM. LVM.

## 2022-09-25 NOTE — Telephone Encounter (Signed)
LVM for husband to call office.

## 2022-09-25 NOTE — Telephone Encounter (Signed)
Took call from phone staff and spoke with husband. Relayed Dr. Georgina Peer message. He is having difficulty getting her to go for imaging. Does not feel CT would convince her to go. He will keep this in mind though. Hopefully they can convince her to proceed with MRI.   He would like to try her on memory med. Aware Dr. Billey Gosling left early today but will send to her to send in rx to  [place], [place]      I will then call him back tomorrow to go over. He verbalized understanding.

## 2022-09-25 NOTE — Telephone Encounter (Signed)
Took call from phone staff and spoke w/ husband. Unable to get his wife to go for MRI that is ordered by Dr. Billey Gosling. Wife feels nothing is wrong with her. Gets very defensive. He has tried explaining to her that MRI brain is to help r/o other causes for sx but she forgets about conversation and goes back to not wanting to complete.She has sister from [place] that may visit in the next few months that he is going to see if she can try to help him convince her to go.   He is wanting to know what Dr. Billey Gosling recommends at this point. Aware I will send to MD for review.

## 2022-09-25 NOTE — Telephone Encounter (Signed)
I do think it's important to get an MRI for her if it's possible for her family to convince her. If she takes an extra seroquel before the MRI it may help calm her down long enough to get the study done. Alternately we could try to do a CT scan which isn't as detailed of a study, but would be faster so she wouldn't have to be in the machine as long.   In the meantime I could send in a memory medication and see if that helps. Does he think she would be willing to take a pill for her memory?

## 2022-09-25 NOTE — Telephone Encounter (Signed)
Left another VM for husband to call office back.

## 2022-09-26 MED ORDER — DONEPEZIL HCL 5 MG PO TABS: 5.0000 mg | ORAL_TABLET | Freq: Every day | ORAL | 0 refills | Status: DC

## 2022-09-26 MED ORDER — DONEPEZIL HCL 10 MG PO TABS: 10.0000 mg | ORAL_TABLET | Freq: Every day | ORAL | 6 refills | Status: DC

## 2022-09-26 NOTE — Telephone Encounter (Signed)
Took call from phone staff and spoke with husband. Relayed Dr. Georgina Peer recommendation. He will have wife start on '5mg'$  po q am with breakfastx4wk. Then go to '10mg'$  po q am w/ breakfast after if tolerating well. He will call back if he has any further questions/concerns.

## 2022-09-26 NOTE — Addendum Note (Signed)
Addended by: Genia Harold on: 09/26/2022 09:08 AM   Modules accepted: Orders

## 2022-09-26 NOTE — Telephone Encounter (Signed)
LVM for husband to call office back.

## 2022-09-26 NOTE — Telephone Encounter (Signed)
Rx for donepezil sent to her pharmacy

## 2022-10-16 DIAGNOSIS — F22 Delusional disorders: Secondary | ICD-10-CM | POA: Diagnosis not present

## 2022-10-16 DIAGNOSIS — R413 Other amnesia: Secondary | ICD-10-CM | POA: Diagnosis not present

## 2022-10-16 DIAGNOSIS — I10 Essential (primary) hypertension: Secondary | ICD-10-CM | POA: Diagnosis not present

## 2022-10-16 DIAGNOSIS — E785 Hyperlipidemia, unspecified: Secondary | ICD-10-CM | POA: Diagnosis not present

## 2022-10-16 DIAGNOSIS — E1165 Type 2 diabetes mellitus with hyperglycemia: Secondary | ICD-10-CM | POA: Diagnosis not present

## 2022-11-19 ENCOUNTER — Other Ambulatory Visit: Payer: Self-pay | Admitting: Psychiatry

## 2022-11-19 NOTE — Telephone Encounter (Signed)
Last seen on 07/03/22 per note " Continue Seroquel 25 mg QHS - Consider starting donepezil pending MRI results   Follow up scheduled on 01/15/23 Last filled on 11/19/22

## 2022-12-17 ENCOUNTER — Other Ambulatory Visit: Payer: Self-pay | Admitting: Psychiatry

## 2022-12-17 NOTE — Telephone Encounter (Signed)
Pt last seen on 07/03/22 per note " Continue Seroquel 25 mg QHS "  Follow up visit scheduled on 01/15/23  Last filled on 11/19/22 # 30 tablets ( 30 day supply)

## 2023-01-13 ENCOUNTER — Telehealth: Payer: Self-pay | Admitting: Psychiatry

## 2023-01-13 NOTE — Telephone Encounter (Signed)
Pt husband called. Stated he needs to know the purpose of this appointment.

## 2023-01-13 NOTE — Telephone Encounter (Signed)
I called to speak with Sherri Garza and informed him why the visit is needed due to memory loss follow up. Pt was to come back for 6 month follow up. Sherri Garza (husband) said that pt stopped the generic Seroquel 25 mg ( he said pt refused to take) she is still taking donepezil 10 mg tablet daily. She refused MRI order by Dr.Chima, husband states pt reports nonething is wrong with her. Husband states he will try to get her to the visit on 01/15/23

## 2023-01-14 ENCOUNTER — Other Ambulatory Visit: Payer: Self-pay | Admitting: Psychiatry

## 2023-01-14 NOTE — Progress Notes (Unsigned)
No chief complaint on file.   HISTORY OF PRESENT ILLNESS:  01/14/23 ALL:  Sherri Garza is a 82 y.o. female here today for follow up for memory loss. She was seen in consult with Dr Delena Bali 06/2022. MRI recommended, however, patient refused. She was started on donepezil and quetiapine continued. Mr Wolfrey called 01/13/23 inquiring about need for appt. He reported she refused to continue quetiapine but has continued donepezil.   Since,    HISTORY (copied from Dr Quentin Mulling previous note)  The patient presents for evaluation of memory loss which has been present over the past 4 years. Husband states this started after a dental X-ray. For 2 years after this she would wake up in the middle of the night screaming that the dentist was trying to kill her. She would be inconsolable for 1-2 hours each night. This did improve over time but her memory continued to decline, especially over the past year. She has started forgetting names and dates. She cannot remember her cats' names.  She has to write multiple notes to remember things and is misplacing objects all over the house. Forgets conversations the day after she has them. She does continue to get more confused at night, so she was prescribed aripiprazole by her PCP. This helped with the confusion but she continued to wander around the house at night. She was then prescribed Seroquel, which has been more effective and helped her sleep through the night. She is in denial about her memory loss, stating she chooses to forget things because she does not want to remember bad memories.   She has also developed daily severe right-sided headaches over the past couple of years. She was prescribed indomethacin for headaches, but this was discontinued as it was reportedly affecting her blood counts.    TBI:  No past history of TBI Stroke:  no past history of stroke Seizures:  no past history of seizures Sleep: She sleeps well at bedtime. She does have increased  confusion in the evening, which has improved with Seroquel Mood: She has become socially isolated since the pandemic. Husband notes she has been more irritable and will say angry things, then will act as if they never fought.   Functional status: Patient lives with her husband Cooking: cooks without issues Shopping: husband does the shopping Driving: She doesn't drive Bills: Husband manages finances,  Medications: Husband helps manage her medications because her blood sugar was getting out of control Ever left the stove on by accident?: no Forget how to use items around the house?: no Getting lost going to familiar places?: no Forgetting loved ones names?: yes Word finding difficulty? none   OTHER MEDICAL CONDITIONS: DM2, glaucoma, vitamin D deficiency   REVIEW OF SYSTEMS: Out of a complete 14 system review of symptoms, the patient complains only of the following symptoms, and all other reviewed systems are negative.   ALLERGIES: Allergies  Allergen Reactions   Prevagen [Apoaequorin] Anaphylaxis   Bacitracin    Ciprofloxacin    Doxycycline    Januvia [Sitagliptin]    Metformin And Related Diarrhea   Penicillins      HOME MEDICATIONS: Outpatient Medications Prior to Visit  Medication Sig Dispense Refill   donepezil (ARICEPT) 10 MG tablet Take 1 tablet (10 mg total) by mouth daily with breakfast. 30 tablet 6   donepezil (ARICEPT) 5 MG tablet Take 1 tablet (5 mg total) by mouth daily with breakfast. Take for 4 weeks, then increase to 10 mg daily 30  tablet 0   glipiZIDE (GLUCOTROL XL) 5 MG 24 hr tablet Take 5 mg by mouth daily with breakfast.     Krill Oil (OMEGA-3) 500 MG CAPS Take 500 mg by mouth daily.     Magnesium 300 MG CAPS Take 300 mg by mouth daily.     Multiple Vitamin (MULTIVITAMIN) tablet Take 1 tablet by mouth daily.     QUEtiapine (SEROQUEL) 25 MG tablet TAKE ONE TABLET BY MOUTH AT BEDTIME 30 tablet 0   sitaGLIPtin-metformin (JANUMET) 50-1000 MG tablet Take 1  tablet by mouth 2 (two) times daily with a meal.     No facility-administered medications prior to visit.     PAST MEDICAL HISTORY: Past Medical History:  Diagnosis Date   Bell's palsy    2007 and 2019 brought on by stress   Cancer [facility]) 1999   Right breast cancer   Depression    Glaucoma    IBS (irritable bowel syndrome)    Memory loss    Vitamin D deficiency      PAST SURGICAL HISTORY: Past Surgical History:  Procedure Laterality Date   APPENDECTOMY     LAPAROSCOPIC OVARIAN CYSTECTOMY     MASTECTOMY Right 1999     FAMILY HISTORY: No family history on file.   SOCIAL HISTORY: Social History   Socioeconomic History   Marital status: Married    Spouse name: Not on file   Number of children: Not on file   Years of education: Not on file   Highest education level: Not on file  Occupational History   Not on file  Tobacco Use   Smoking status: Not on file   Smokeless tobacco: Not on file  Substance and Sexual Activity   Alcohol use: Not on file   Drug use: Not on file   Sexual activity: Not on file  Other Topics Concern   Not on file  Social History Narrative   Not on file   Social Determinants of Health   Financial Resource Strain: Not on file  Food Insecurity: Not on file  Transportation Needs: Not on file  Physical Activity: Not on file  Stress: Not on file  Social Connections: Not on file  Intimate Partner Violence: Not on file     PHYSICAL EXAM  There were no vitals filed for this visit. There is no height or weight on file to calculate BMI.  Generalized: Well developed, in no acute distress  Cardiology: normal rate and rhythm, no murmur auscultated  Respiratory: clear to auscultation bilaterally    Neurological examination  Mentation: Alert oriented to time, place, history taking. Follows all commands speech and language fluent Cranial nerve II-XII: Pupils were equal round reactive to light. Extraocular movements were full, visual field  were full on confrontational test. Facial sensation and strength were normal. Uvula tongue midline. Head turning and shoulder shrug  were normal and symmetric. Motor: The motor testing reveals 5 over 5 strength of all 4 extremities. Good symmetric motor tone is noted throughout.  Sensory: Sensory testing is intact to soft touch on all 4 extremities. No evidence of extinction is noted.  Coordination: Cerebellar testing reveals good finger-nose-finger and heel-to-shin bilaterally.  Gait and station: Gait is normal. Tandem gait is normal. Romberg is negative. No drift is seen.  Reflexes: Deep tendon reflexes are symmetric and normal bilaterally.    DIAGNOSTIC DATA (LABS, IMAGING, TESTING) - I reviewed patient records, labs, notes, testing and imaging myself where available.  No results found for: "WBC", "HGB", "  HCT", "MCV", "PLT" No results found for: "NA", "K", "CL", "CO2", "GLUCOSE", "BUN", "CREATININE", "CALCIUM", "PROT", "ALBUMIN", "AST", "ALT", "ALKPHOS", "BILITOT", "GFRNONAA", "GFRAA" No results found for: "CHOL", "HDL", "LDLCALC", "LDLDIRECT", "TRIG", "CHOLHDL" No results found for: "HGBA1C" No results found for: "VITAMINB12" No results found for: "TSH"      No data to display              07/03/2022    1:50 PM  Montreal Cognitive Assessment   Visuospatial/ Executive (0/5) 0  Naming (0/3) 3  Attention: Read list of digits (0/2) 1  Attention: Read list of letters (0/1) 1  Attention: Serial 7 subtraction starting at 100 (0/3) 0  Language: Repeat phrase (0/2) 0  Language : Fluency (0/1) 0  Abstraction (0/2) 0  Delayed Recall (0/5) 0  Orientation (0/6) 0  Total 5     ASSESSMENT AND PLAN  82 y.o. year old female  has a past medical history of Bell's palsy, Cancer (HCC) (1999), Depression, Glaucoma, IBS (irritable bowel syndrome), Memory loss, and Vitamin D deficiency. here with    No diagnosis found.  Marianne Sofia ***.  Healthy lifestyle habits encouraged. *** will  follow up with PCP as directed. *** will return to see me in ***, sooner if needed. *** verbalizes understanding and agreement with this plan.   No orders of the defined types were placed in this encounter.    No orders of the defined types were placed in this encounter.    Shawnie Dapper, MSN, FNP-C 01/14/2023, 8:28 AM  [facility] [address], [address] [place], [place] [address] [phone]

## 2023-01-14 NOTE — Patient Instructions (Incomplete)
Below is our plan:  We will continue donepezil 10mg  daily. We will start a low dose of memantine 5mg  twice daily. For the first week, take 5mg  daily. Then increase memantine 5mg  twice daily.   Please make sure you are staying well hydrated. I recommend 50-60 ounces daily. Well balanced diet and regular exercise encouraged. Consistent sleep schedule with 6-8 hours recommended.   Please continue follow up with care team as directed.   Follow up with me in 6 months   You may receive a survey regarding today's visit. I encourage you to leave honest feed back as I do use this information to improve patient care. Thank you for seeing me today!   Management of Memory Problems   There are some general things you can do to help manage your memory problems.  Your memory may not in fact recover, but by using techniques and strategies you will be able to manage your memory difficulties better.   1)  Establish a routine. Try to establish and then stick to a regular routine.  By doing this, you will get used to what to expect and you will reduce the need to rely on your memory.  Also, try to do things at the same time of day, such as taking your medication or checking your calendar first thing in the morning. Think about think that you can do as a part of a regular routine and make a list.  Then enter them into a daily planner to remind you.  This will help you establish a routine.   2)  Organize your environment. Organize your environment so that it is uncluttered.  Decrease visual stimulation.  Place everyday items such as keys or cell phone in the same place every day (ie.  Basket next to front door) Use post it notes with a brief message to yourself (ie. Turn off light, lock the door) Use labels to indicate where things go (ie. Which cupboards are for food, dishes, etc.) Keep a notepad and pen by the telephone to take messages   3)  Memory Aids A diary or journal/notebook/daily planner Making a  list (shopping list, chore list, to do list that needs to be done) Using an alarm as a reminder (kitchen timer or cell phone alarm) Using cell phone to store information (Notes, Calendar, Reminders) Calendar/White board placed in a prominent position Post-it notes   In order for memory aids to be useful, you need to have good habits.  It's no good remembering to make a note in your journal if you don't remember to look in it.  Try setting aside a certain time of day to look in journal.   4)  Improving mood and managing fatigue. There may be other factors that contribute to memory difficulties.  Factors, such as anxiety, depression and tiredness can affect memory. Regular gentle exercise can help improve your mood and give you more energy. Exercise: there are short videos created by the [employer] on Health specially for older adults: https://bit.ly/2I30q97.  Mediterranean diet: which emphasizes fruits, vegetables, whole grains, legumes, fish, and other seafood; unsaturated fats such as olive oils; and low amounts of red meat, eggs, and sweets. A variation of this, called MIND (Mediterranean-DASH Intervention for Neurodegenerative Delay) incorporates the DASH (Dietary Approaches to Stop Hypertension) diet, which has been shown to lower high blood pressure, a risk factor for Alzheimer's disease. More information at: ExitMarketing.de.  Aerobic exercise that improve heart health is also good for the mind.  [employer] on Aging have short videos for exercises that you can do at home: BlindWorkshop.com.pt Simple relaxation techniques may help relieve symptoms of anxiety Try to get back to completing activities or hobbies you enjoyed doing in the past. Learn to pace yourself through activities to decrease fatigue. Find out about some local support groups where you can share experiences with others. Try and  achieve 7-8 hours of sleep at night.   Resources for Family/Caregiver  Online caregiver support groups can be found at WesternTunes.it or call Alzheimer's Association's 24/7 hotline: (201)266-8605. [facility] offers in-person, virtual support groups and individual counseling for both care partners and persons with memory loss. Call for more information at [phone].   Advanced care plan: there are two types of Power of Attorney: healthcare and durable. Healthcare POA is a designated person to make healthcare decisions on your behalf if you were too sick to make them yourself. This person can be selected and documented by your physician. Durable POA has to be set up with a lawyer who takes charge of your finances and estate if you were too sick or cognitively impaired to manage your finances accurately. You can find a local Elder Therapist, art here: NewportRanch.at.  Check out www.planyourlifespan.org, which will help you plan before a crisis and decide who will take care of life considerations in a circumstance where you may not be able to speak for yourself.   Helpful books (available on [employer] or your local bookstore):  By Dr. Carl Best: Keeping Love Alive as Memories Fade: The 5 Love Languages and the Alzheimer's Journey Apr 29, 2015 The Dementia Care Partner's Workbook: A Guide for Understanding, Education, and [employer] - December 27, 2017.  Both available for less than $15.   "Coping with behavior change in dementia: a family caregiver's guide" by Ricardo Jericho & Valora Piccolo "A Caregiver's Guide to Dementia: Using Activities and Other Strategies to Prevent, Reduce and Manage Behavioral Symptoms" by Mahala Menghini Gitlin and [employer].  Youth worker of Joy for the Person with Alzheimer's or Dementia" 4th edition by Tama High  Caregiver videos on common behaviors related to dementia: PopulationGame.pl  [place]  Caregiver Portal: free to sign up, links to local resources: [email or web address]

## 2023-01-15 ENCOUNTER — Ambulatory Visit (INDEPENDENT_AMBULATORY_CARE_PROVIDER_SITE_OTHER): Payer: Medicare Other | Admitting: Family Medicine

## 2023-01-15 ENCOUNTER — Encounter: Payer: Self-pay | Admitting: Family Medicine

## 2023-01-15 VITALS — BP 162/76 | HR 69 | Ht 62.0 in | Wt 140.5 lb

## 2023-01-15 DIAGNOSIS — R413 Other amnesia: Secondary | ICD-10-CM | POA: Diagnosis not present

## 2023-01-15 MED ORDER — MEMANTINE HCL 10 MG PO TABS: 10.0000 mg | ORAL_TABLET | Freq: Two times a day (BID) | ORAL | 3 refills | Status: DC

## 2023-01-21 ENCOUNTER — Encounter: Payer: Self-pay | Admitting: Family Medicine

## 2023-03-03 DIAGNOSIS — Z79899 Other long term (current) drug therapy: Secondary | ICD-10-CM | POA: Diagnosis not present

## 2023-03-03 DIAGNOSIS — Z23 Encounter for immunization: Secondary | ICD-10-CM | POA: Diagnosis not present

## 2023-03-03 DIAGNOSIS — I10 Essential (primary) hypertension: Secondary | ICD-10-CM | POA: Diagnosis not present

## 2023-03-03 DIAGNOSIS — E1165 Type 2 diabetes mellitus with hyperglycemia: Secondary | ICD-10-CM | POA: Diagnosis not present

## 2023-03-03 DIAGNOSIS — Z1331 Encounter for screening for depression: Secondary | ICD-10-CM | POA: Diagnosis not present

## 2023-03-03 DIAGNOSIS — E785 Hyperlipidemia, unspecified: Secondary | ICD-10-CM | POA: Diagnosis not present

## 2023-03-03 DIAGNOSIS — F22 Delusional disorders: Secondary | ICD-10-CM | POA: Diagnosis not present

## 2023-03-03 DIAGNOSIS — Z0000 Encounter for general adult medical examination without abnormal findings: Secondary | ICD-10-CM | POA: Diagnosis not present

## 2023-03-03 DIAGNOSIS — R413 Other amnesia: Secondary | ICD-10-CM | POA: Diagnosis not present

## 2023-03-03 DIAGNOSIS — E559 Vitamin D deficiency, unspecified: Secondary | ICD-10-CM | POA: Diagnosis not present

## 2023-03-03 DIAGNOSIS — R519 Headache, unspecified: Secondary | ICD-10-CM | POA: Diagnosis not present

## 2023-03-03 DIAGNOSIS — R251 Tremor, unspecified: Secondary | ICD-10-CM | POA: Diagnosis not present

## 2023-03-13 ENCOUNTER — Encounter: Payer: Self-pay | Admitting: Family Medicine

## 2023-03-13 ENCOUNTER — Telehealth: Payer: Self-pay | Admitting: Family Medicine

## 2023-03-13 NOTE — Telephone Encounter (Signed)
LVM, sent mychart msg, sent cx letter advising pt of 12/26 appt cancellation- office closed.

## 2023-03-18 ENCOUNTER — Inpatient Hospital Stay: Payer: Medicare Other | Attending: Internal Medicine | Admitting: Internal Medicine

## 2023-03-18 ENCOUNTER — Inpatient Hospital Stay: Payer: Medicare Other

## 2023-03-18 ENCOUNTER — Encounter: Payer: Self-pay | Admitting: Internal Medicine

## 2023-03-18 VITALS — Ht 62.0 in

## 2023-03-18 DIAGNOSIS — Z9011 Acquired absence of right breast and nipple: Secondary | ICD-10-CM | POA: Insufficient documentation

## 2023-03-18 DIAGNOSIS — E119 Type 2 diabetes mellitus without complications: Secondary | ICD-10-CM | POA: Diagnosis not present

## 2023-03-18 DIAGNOSIS — Z853 Personal history of malignant neoplasm of breast: Secondary | ICD-10-CM | POA: Insufficient documentation

## 2023-03-18 DIAGNOSIS — D72820 Lymphocytosis (symptomatic): Secondary | ICD-10-CM

## 2023-03-18 DIAGNOSIS — F0390 Unspecified dementia without behavioral disturbance: Secondary | ICD-10-CM | POA: Diagnosis not present

## 2023-03-18 DIAGNOSIS — Z801 Family history of malignant neoplasm of trachea, bronchus and lung: Secondary | ICD-10-CM | POA: Insufficient documentation

## 2023-03-18 LAB — CBC WITH DIFFERENTIAL/PLATELET
Abs Immature Granulocytes: 0.03 10*3/uL (ref 0.00–0.07)
Basophils Absolute: 0.1 10*3/uL (ref 0.0–0.1)
Basophils Relative: 1 %
Eosinophils Absolute: 0.2 10*3/uL (ref 0.0–0.5)
Eosinophils Relative: 1 %
HCT: 39.2 % (ref 36.0–46.0)
Hemoglobin: 13.1 g/dL (ref 12.0–15.0)
Immature Granulocytes: 0 %
Lymphocytes Relative: 23 %
Lymphs Abs: 2.8 10*3/uL (ref 0.7–4.0)
MCH: 29.2 pg (ref 26.0–34.0)
MCHC: 33.4 g/dL (ref 30.0–36.0)
MCV: 87.5 fL (ref 80.0–100.0)
Monocytes Absolute: 0.9 10*3/uL (ref 0.1–1.0)
Monocytes Relative: 7 %
Neutro Abs: 8.2 10*3/uL — ABNORMAL HIGH (ref 1.7–7.7)
Neutrophils Relative %: 68 %
Platelets: 405 10*3/uL — ABNORMAL HIGH (ref 150–400)
RBC: 4.48 MIL/uL (ref 3.87–5.11)
RDW: 13.3 % (ref 11.5–15.5)
WBC: 12.1 10*3/uL — ABNORMAL HIGH (ref 4.0–10.5)
nRBC: 0 % (ref 0.0–0.2)

## 2023-03-18 LAB — COMPREHENSIVE METABOLIC PANEL
ALT: 23 U/L (ref 0–44)
AST: 28 U/L (ref 15–41)
Albumin: 4 g/dL (ref 3.5–5.0)
Alkaline Phosphatase: 67 U/L (ref 38–126)
Anion gap: 10 (ref 5–15)
BUN: 15 mg/dL (ref 8–23)
CO2: 23 mmol/L (ref 22–32)
Calcium: 9 mg/dL (ref 8.9–10.3)
Chloride: 101 mmol/L (ref 98–111)
Creatinine, Ser: 0.89 mg/dL (ref 0.44–1.00)
GFR, Estimated: >60 mL/min (ref >60)
Glucose, Bld: 209 mg/dL — ABNORMAL HIGH (ref 70–99)
Potassium: 4.5 mmol/L (ref 3.5–5.1)
Sodium: 134 mmol/L — ABNORMAL LOW (ref 135–145)
Total Bilirubin: 0.5 mg/dL (ref 0.3–1.2)
Total Protein: 7.1 g/dL (ref 6.5–8.1)

## 2023-03-18 LAB — TECHNOLOGIST SMEAR REVIEW: Plt Morphology: NORMAL

## 2023-03-18 LAB — LACTATE DEHYDROGENASE: LDH: 136 U/L (ref 98–192)

## 2023-03-18 NOTE — Assessment & Plan Note (Addendum)
#   Leukocytosis with predominant lymphocytosis [August 2024-white count 13,000; lymphocyte 3.4; normal hemoglobin platelets]; normal renal function calcium.  # Elevated white count/lymphocytosis.  Patient fairly asymptomatic from underlying blood work.  # I I had a long discussion with the patient regarding the multiple etiologies of elevated white count/lymphocytosis including but not limited to reactive causes like infection; inflammation.  However other etiologies include malignancy/leukemia etc..   #Discussed with the patient and family that-lymphocytosis is likely suggestive of a low-grade lymphoma/leukemia; less likely reactive.  Recommend CBC CMP LDH; peripheral blood flow cytometry.  However hold off a bone marrow biopsy/ imaging at this time.   # Discussed that even if patient is diagnosed with low-grade  lymphoma/leukemia- especially asymptomatic/with normal hemoglobin/platelets-surveillance would be the choice of management.  However await above work-up for further recommendations and plan.  # Diabetes: stable.   # Dementia: stable.   Thank you Dr. Orson Aloe for allowing me to participate in the care of your pleasant patient. Please do not hesitate to contact me with questions or concerns in the interim.  # DISPOSITION: # Labs today- CBC CMP;  peripheral smear; also peripheral blood flow cytometry; LDH;- # Follow-up in the next 2 to 3 weeks-MD no labs- Dr.B

## 2023-03-18 NOTE — Progress Notes (Signed)
New pt here for elevated WBC's. New problem according to husband. No fevers colds or infections. Denies pain. Does have headaches in the back of her head near brain stem. Appetite is good. Pt has energy for the most part. Hx Breast Ca in 1997. Mastectomy and took Tamoxifen for 1 year. Stopped AI due to side effects.

## 2023-03-18 NOTE — Progress Notes (Signed)
[facility] OFFICE PROGRESS NOTE  Patient Care Team: Emilio Aspen, MD as PCP - General (Internal Medicine) Earna Coder, MD as Consulting Physician (Oncology)   # HEMATOLOGY HISTORY:  # LEUCOCYTOSIS- WBC-; N; L; Hb- platelets  # Right sided breast cancer [s/p mastectomy]- s/p TAM; no RT; No chemo.    Oncology History   No history exists.    INTERVAL HISTORY: Patient ambulating-independently. Accompanied by family.    Sherri Garza 82 y.o.  female pleasant mild to moderate dementia patient is here for further evaluation of lymphocytosis.   Patient denies any symptoms.  Lymphocytosis was noted incidentally on routine lab work  Night sweats: none Weight loss:5 pounds in few years.  Early satiety:none  Infections:NONE Splenectomy: NONE Steroids: NONE Smoke: NONE Skin rash: NONE  Review of Systems  Constitutional:  Negative for chills, diaphoresis, fever, malaise/fatigue and weight loss.  HENT:  Negative for nosebleeds and sore throat.   Eyes:  Negative for double vision.  Respiratory:  Negative for cough, hemoptysis, sputum production, shortness of breath and wheezing.   Cardiovascular:  Negative for chest pain, palpitations, orthopnea and leg swelling.  Gastrointestinal:  Negative for abdominal pain, blood in stool, constipation, diarrhea, heartburn, melena, nausea and vomiting.  Genitourinary:  Negative for dysuria, frequency and urgency.  Musculoskeletal:  Negative for back pain and joint pain.  Skin: Negative.  Negative for itching and rash.  Neurological:  Negative for dizziness, tingling, focal weakness, weakness and headaches.  Endo/Heme/Allergies:  Does not bruise/bleed easily.  Psychiatric/Behavioral:  Negative for depression. The patient is not nervous/anxious and does not have insomnia.       PAST MEDICAL HISTORY :  Past Medical History:  Diagnosis Date   Bell's palsy    2007 and 2019 brought on by stress   Breast cancer  (HCC)    Depression    Glaucoma    IBS (irritable bowel syndrome)    Memory loss    Vitamin D deficiency     PAST SURGICAL HISTORY :   Past Surgical History:  Procedure Laterality Date   APPENDECTOMY     LAPAROSCOPIC OVARIAN CYSTECTOMY     MASTECTOMY Right 1999    FAMILY HISTORY :   Family History  Problem Relation Age of Onset   Cancer Mother    Cancer Father    Prostate cancer Father    Cancer Brother    Lung cancer Brother     SOCIAL HISTORY:   Social History   Tobacco Use   Smoking status: Never   Smokeless tobacco: Never  Substance Use Topics   Alcohol use: Not Currently   Drug use: Never    ALLERGIES:  is allergic to prevagen [apoaequorin], bacitracin, ciprofloxacin, doxycycline, januvia [sitagliptin], metformin and related, penicillins, and tetanus toxoids.  MEDICATIONS:  Current Outpatient Medications  Medication Sig Dispense Refill   donepezil (ARICEPT ODT) 10 MG disintegrating tablet Take 10 mg by mouth at bedtime.     glipiZIDE (GLUCOTROL XL) 10 MG 24 hr tablet Take 10 mg by mouth daily.     lactase (LACTAID) 3000 units tablet Take by mouth 3 (three) times daily with meals.     Multiple Vitamin (MULTIVITAMIN) tablet Take 1 tablet by mouth daily.     QUEtiapine (SEROQUEL XR) 50 MG TB24 24 hr tablet Take 50 mg by mouth at bedtime. 1/2 tab daily. Pt takes 25 mg     sitaGLIPtin-metformin (JANUMET) 50-1000 MG tablet Take 1 tablet by mouth 2 (two) times daily  with a meal.     No current facility-administered medications for this visit.    PHYSICAL EXAMINATION:  Ht 5\' 2"  (1.575 m)   BMI 25.70 kg/m   There were no vitals filed for this visit.  Physical Exam Vitals and nursing note reviewed.  HENT:     Head: Normocephalic and atraumatic.     Mouth/Throat:     Pharynx: Oropharynx is clear.  Eyes:     Extraocular Movements: Extraocular movements intact.     Pupils: Pupils are equal, round, and reactive to light.  Cardiovascular:     Rate and  Rhythm: Normal rate and regular rhythm.  Pulmonary:     Comments: Decreased breath sounds bilaterally.  Abdominal:     Palpations: Abdomen is soft.  Musculoskeletal:        General: Normal range of motion.     Cervical back: Normal range of motion.  Skin:    General: Skin is warm.  Neurological:     General: No focal deficit present.     Mental Status: She is alert and oriented to person, place, and time.  Psychiatric:        Behavior: Behavior normal.        Judgment: Judgment normal.        LABORATORY DATA:  I have reviewed the data as listed No results found for: "NA", "K", "CL", "CO2", "GLUCOSE", "BUN", "CREATININE", "CALCIUM", "PROT", "ALBUMIN", "AST", "ALT", "ALKPHOS", "BILITOT", "GFRNONAA", "GFRAA"  No results found for: "SPEP", "UPEP"  No results found for: "WBC", "NEUTROABS", "HGB", "HCT", "MCV", "PLT"    Chemistry   No results found for: "NA", "K", "CL", "CO2", "BUN", "CREATININE", "GLU" No results found for: "CALCIUM", "ALKPHOS", "AST", "ALT", "BILITOT"     RADIOGRAPHIC STUDIES: I have personally reviewed the radiological images as listed and agreed with the findings in the report. No results found.   ASSESSMENT & PLAN:  Persistent lymphocytosis # Leukocytosis with predominant lymphocytosis [August 2024-white count 13,000; lymphocyte 3.4; normal hemoglobin platelets]; normal renal function calcium.  # Elevated white count/lymphocytosis.  Patient fairly asymptomatic from underlying blood work.  # I I had a long discussion with the patient regarding the multiple etiologies of elevated white count/lymphocytosis including but not limited to reactive causes like infection; inflammation.  However other etiologies include malignancy/leukemia etc..   #Discussed with the patient and family that-lymphocytosis is likely suggestive of a low-grade lymphoma/leukemia; less likely reactive.  Recommend CBC CMP LDH; peripheral blood flow cytometry.  However hold off a bone  marrow biopsy/ imaging at this time.   # Discussed that even if patient is diagnosed with low-grade  lymphoma/leukemia- especially asymptomatic/with normal hemoglobin/platelets-surveillance would be the choice of management.  However await above work-up for further recommendations and plan.  # Diabetes: stable.   # Dementia: stable.   Thank you Dr. Orson Aloe for allowing me to participate in the care of your pleasant patient. Please do not hesitate to contact me with questions or concerns in the interim.  # DISPOSITION: # Labs today- CBC CMP;  peripheral smear; also peripheral blood flow cytometry; LDH;- # Follow-up in the next 2 to 3 weeks-MD no labs- Dr.B   Orders Placed This Encounter  Procedures   Flow cytometry panel-leukemia/lymphoma work-up    Standing Status:   Future    Number of Occurrences:   1    Standing Expiration Date:   03/17/2024   CBC with Differential/Platelet    Standing Status:   Future    Number of Occurrences:  1    Standing Expiration Date:   03/17/2024   Comprehensive metabolic panel    Standing Status:   Future    Number of Occurrences:   1    Standing Expiration Date:   03/17/2024   Lactate dehydrogenase    Standing Status:   Future    Number of Occurrences:   1    Standing Expiration Date:   03/17/2024   Technologist smear review    Standing Status:   Future    Standing Expiration Date:   03/17/2024    Order Specific Question:   Clinical information:    Answer:   lymphocytosis   All questions were answered. The patient knows to call the clinic with any problems, questions or concerns.      Earna Coder, MD 03/18/2023 3:00 PM

## 2023-03-24 LAB — COMP PANEL: LEUKEMIA/LYMPHOMA: Immunophenotypic Profile: 0

## 2023-04-03 ENCOUNTER — Ambulatory Visit
Admission: RE | Admit: 2023-04-03 | Discharge: 2023-04-03 | Disposition: A | Discharge status: Home / Self Care | Payer: Medicare Other | Source: Ambulatory Visit | Attending: Internal Medicine | Admitting: Internal Medicine

## 2023-04-03 ENCOUNTER — Other Ambulatory Visit: Payer: Self-pay | Admitting: Internal Medicine

## 2023-04-03 DIAGNOSIS — W19XXXA Unspecified fall, initial encounter: Secondary | ICD-10-CM

## 2023-04-03 DIAGNOSIS — G729 Myopathy, unspecified: Secondary | ICD-10-CM | POA: Diagnosis not present

## 2023-04-03 DIAGNOSIS — M5441 Lumbago with sciatica, right side: Secondary | ICD-10-CM | POA: Diagnosis not present

## 2023-04-03 DIAGNOSIS — M4854XA Collapsed vertebra, not elsewhere classified, thoracic region, initial encounter for fracture: Secondary | ICD-10-CM | POA: Diagnosis not present

## 2023-04-03 DIAGNOSIS — M545 Low back pain, unspecified: Secondary | ICD-10-CM | POA: Diagnosis not present

## 2023-04-15 ENCOUNTER — Encounter: Payer: Self-pay | Admitting: Internal Medicine

## 2023-04-16 ENCOUNTER — Inpatient Hospital Stay: Payer: Medicare Other | Admitting: Internal Medicine

## 2023-04-16 DIAGNOSIS — R350 Frequency of micturition: Secondary | ICD-10-CM | POA: Diagnosis not present

## 2023-04-17 DIAGNOSIS — S22080A Wedge compression fracture of T11-T12 vertebra, initial encounter for closed fracture: Secondary | ICD-10-CM | POA: Diagnosis not present

## 2023-04-20 IMAGING — CR DG CHEST 2V
2 series · 2 of 2 positions shown · non-contrast
Comparison: None.

CLINICAL DATA: Right chest wall tenderness.

EXAM:
CHEST - 2 VIEW

[w chest pa]
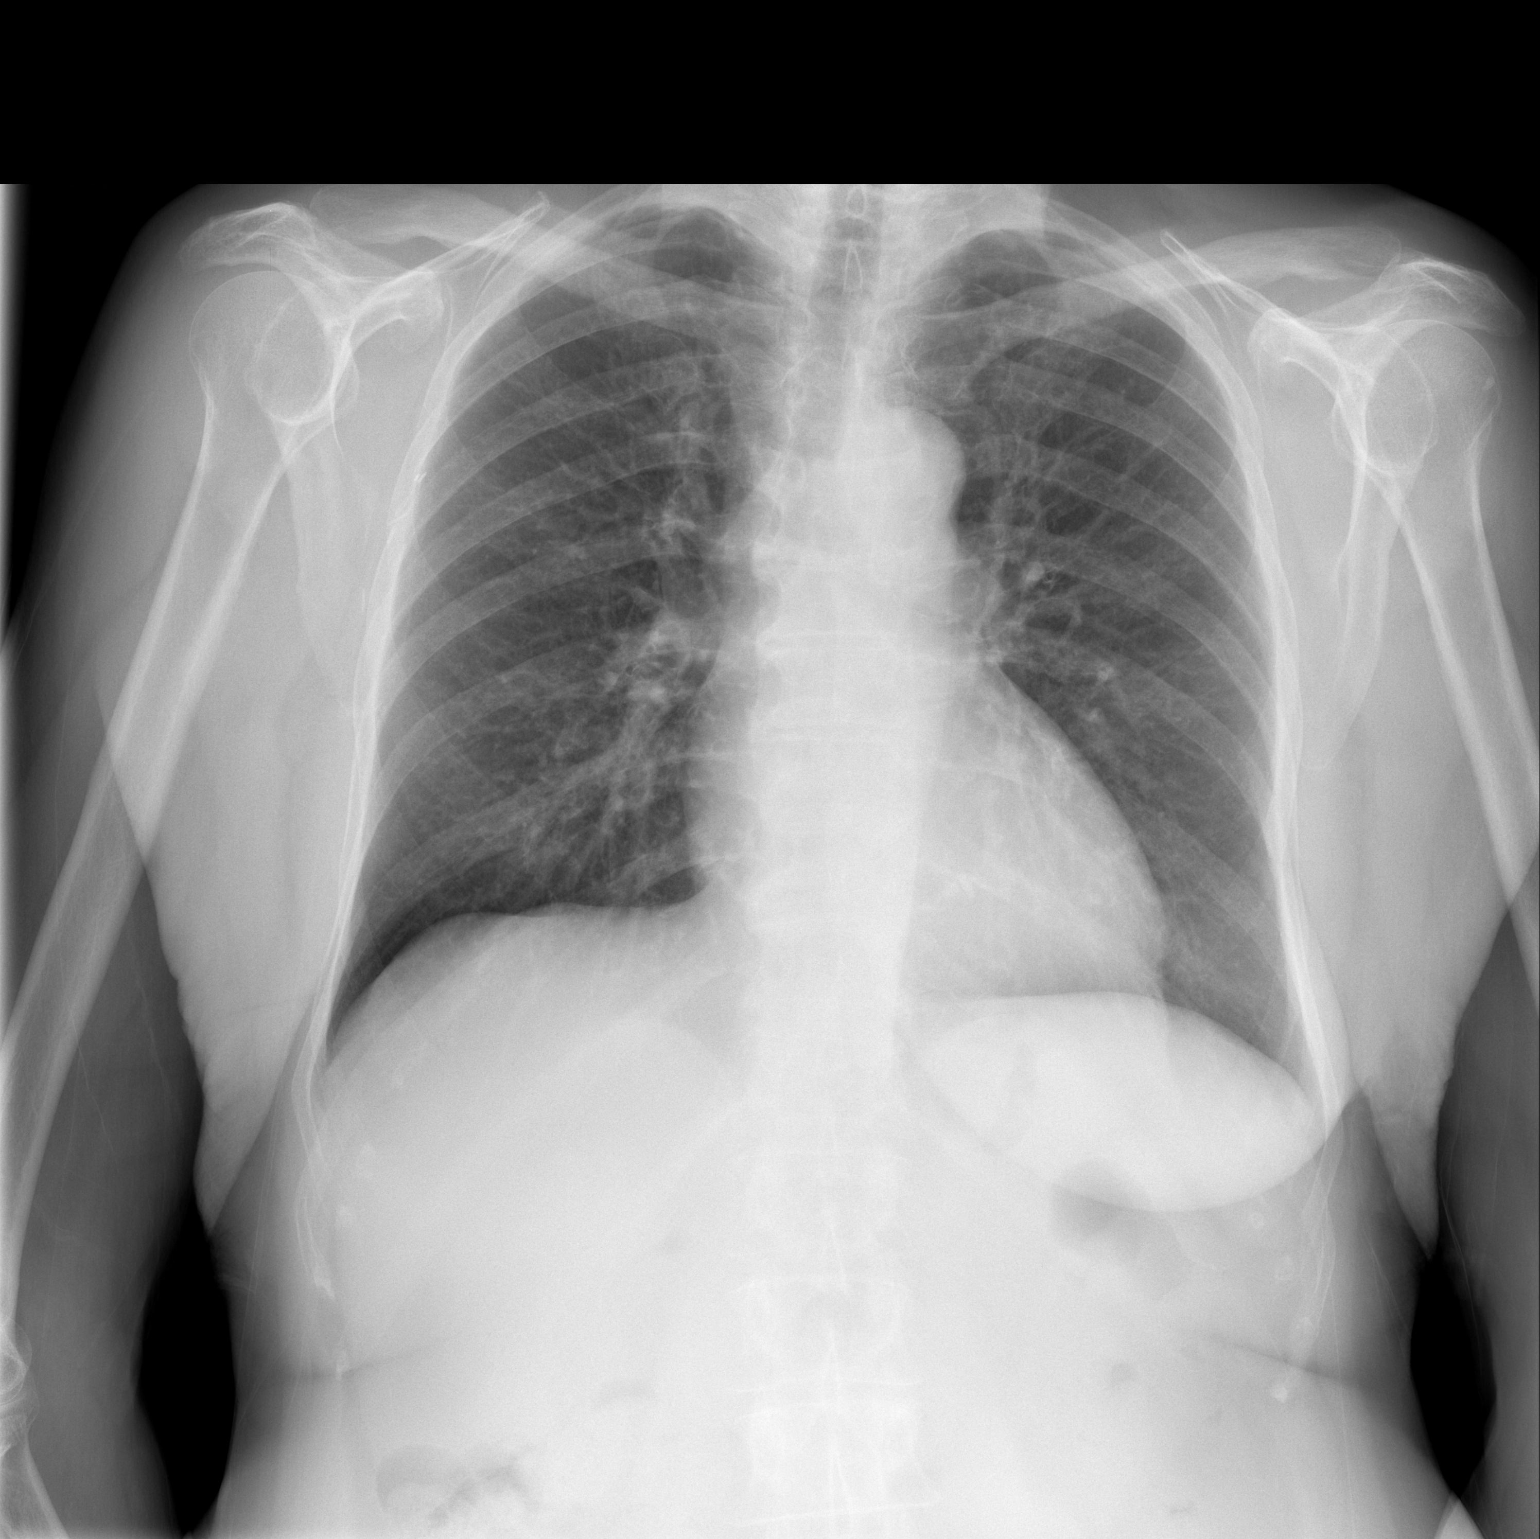

[w chest lat]
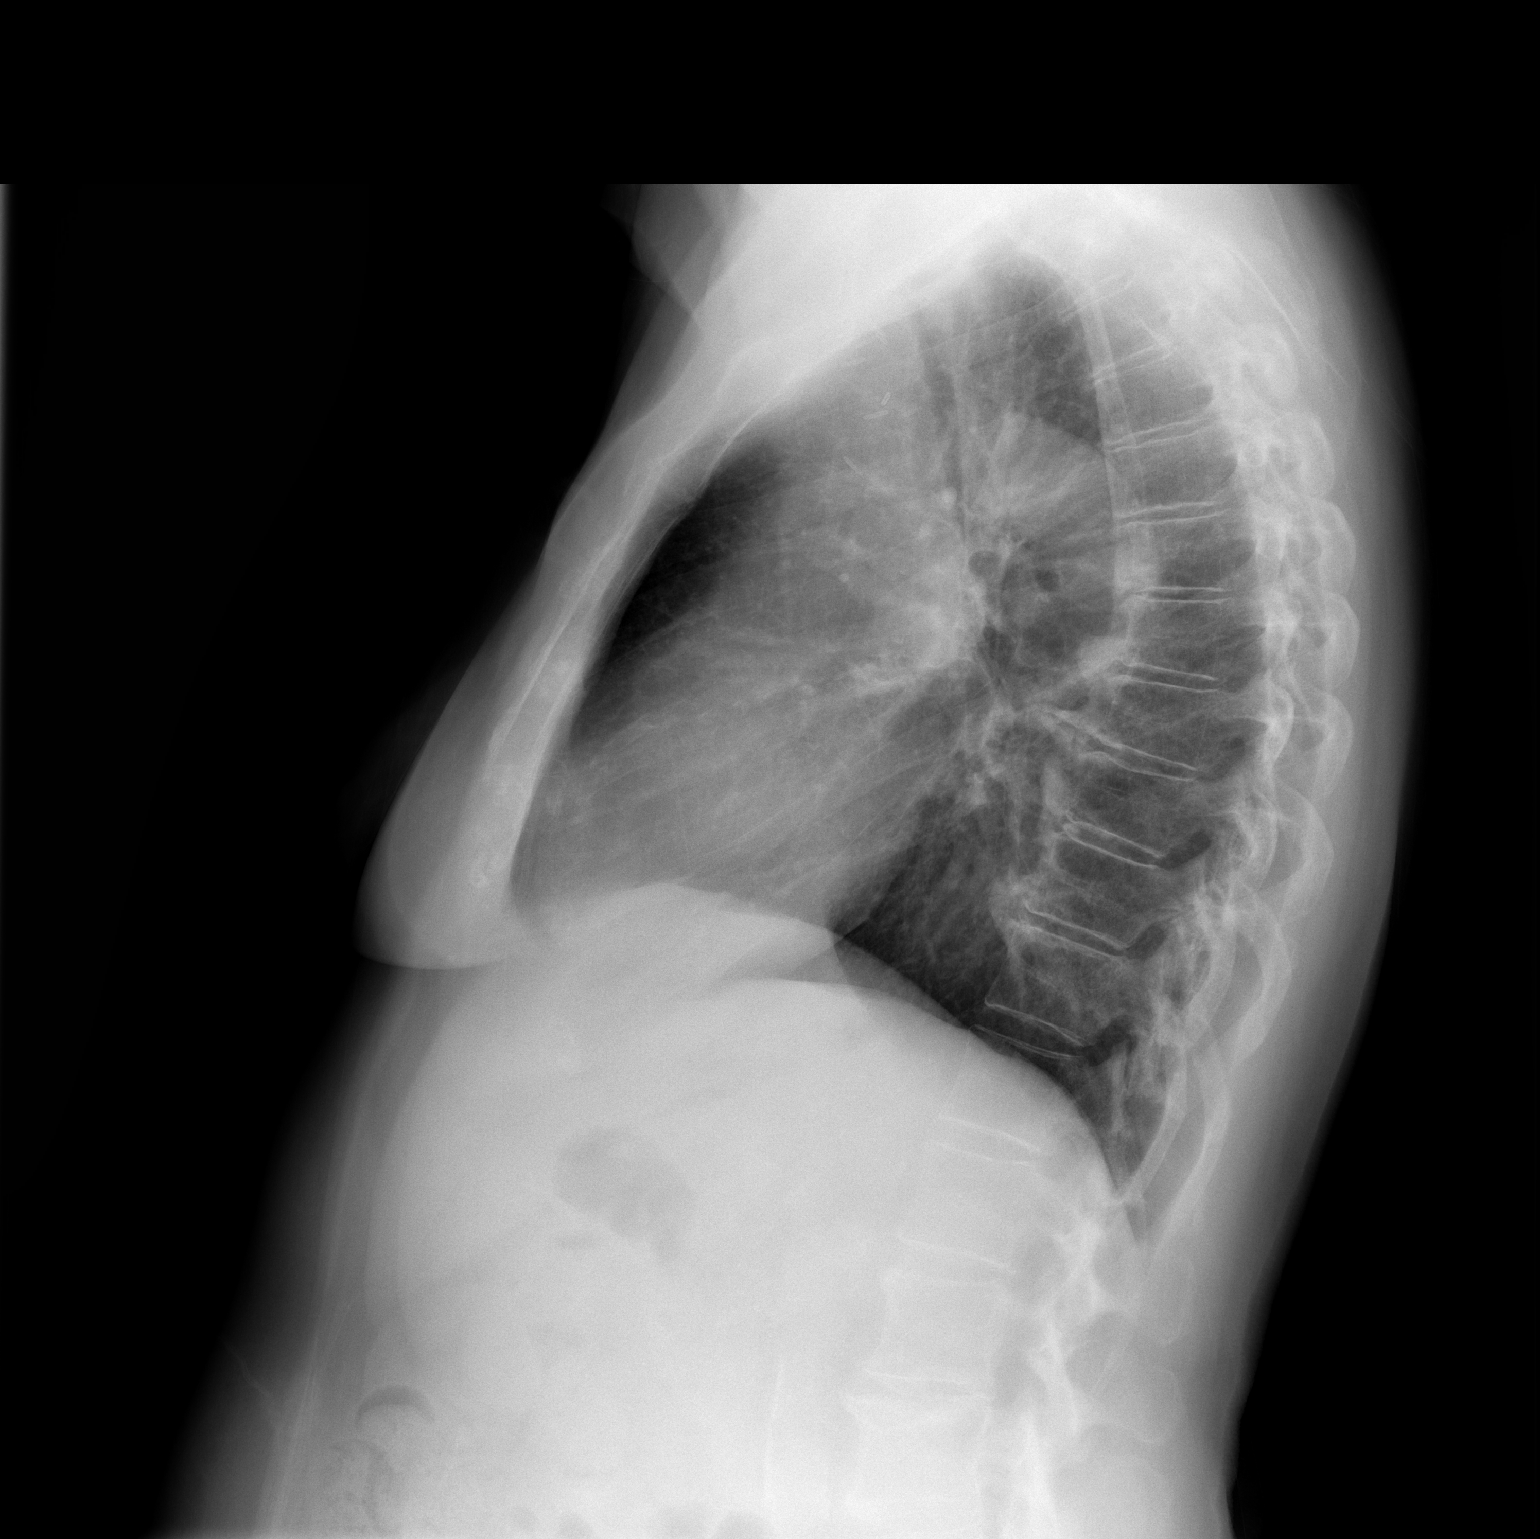

[2 of 2 positions shown; findings below may reference images not displayed]

FINDINGS: The heart size and mediastinal contours are within normal limits.
Both lungs are clear. The visualized skeletal structures are
unremarkable.
IMPRESSION: No active cardiopulmonary disease.

## 2023-04-22 ENCOUNTER — Encounter: Payer: Self-pay | Admitting: Family Medicine

## 2023-04-30 ENCOUNTER — Telehealth: Payer: Self-pay | Admitting: Internal Medicine

## 2023-04-30 NOTE — Telephone Encounter (Signed)
I tried to reach out to the patient's husband regarding blood work done at the cancer center.  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  However if blood counts keep rising-recommend follow-up-discuss further options.  Left a voicemail with above information-and to call us with any questions or concerns.  No further follow-ups recommended at this time.  FYI

## 2023-05-14 DIAGNOSIS — M5136 Other intervertebral disc degeneration, lumbar region with discogenic back pain only: Secondary | ICD-10-CM | POA: Diagnosis not present

## 2023-05-14 DIAGNOSIS — M47816 Spondylosis without myelopathy or radiculopathy, lumbar region: Secondary | ICD-10-CM | POA: Diagnosis not present

## 2023-05-14 DIAGNOSIS — M48061 Spinal stenosis, lumbar region without neurogenic claudication: Secondary | ICD-10-CM | POA: Diagnosis not present

## 2023-05-14 DIAGNOSIS — M4854XA Collapsed vertebra, not elsewhere classified, thoracic region, initial encounter for fracture: Secondary | ICD-10-CM | POA: Diagnosis not present

## 2023-05-27 DIAGNOSIS — M25551 Pain in right hip: Secondary | ICD-10-CM | POA: Diagnosis not present

## 2023-05-27 DIAGNOSIS — S22080A Wedge compression fracture of T11-T12 vertebra, initial encounter for closed fracture: Secondary | ICD-10-CM | POA: Diagnosis not present

## 2023-06-04 ENCOUNTER — Other Ambulatory Visit: Payer: Self-pay | Admitting: *Deleted

## 2023-06-04 MED ORDER — DONEPEZIL HCL 10 MG PO TBDP: 10.0000 mg | ORAL_TABLET | Freq: Every day | ORAL | 2 refills | Status: DC

## 2023-06-04 NOTE — Telephone Encounter (Signed)
Last seen on 01/15/23 Follow up scheduled on 09/22/23

## 2023-06-13 DIAGNOSIS — M25451 Effusion, right hip: Secondary | ICD-10-CM | POA: Diagnosis not present

## 2023-06-13 DIAGNOSIS — S76011A Strain of muscle, fascia and tendon of right hip, initial encounter: Secondary | ICD-10-CM | POA: Diagnosis not present

## 2023-06-13 DIAGNOSIS — R936 Abnormal findings on diagnostic imaging of limbs: Secondary | ICD-10-CM | POA: Diagnosis not present

## 2023-06-13 DIAGNOSIS — M6089 Other myositis, multiple sites: Secondary | ICD-10-CM | POA: Diagnosis not present

## 2023-06-13 DIAGNOSIS — S72051A Unspecified fracture of head of right femur, initial encounter for closed fracture: Secondary | ICD-10-CM | POA: Diagnosis not present

## 2023-07-03 DIAGNOSIS — S72031A Displaced midcervical fracture of right femur, initial encounter for closed fracture: Secondary | ICD-10-CM | POA: Diagnosis not present

## 2023-07-03 DIAGNOSIS — M25551 Pain in right hip: Secondary | ICD-10-CM | POA: Diagnosis not present

## 2023-07-04 NOTE — Patient Instructions (Signed)
DUE TO COVID-19 ONLY TWO VISITORS  (aged 80 and older)  ARE ALLOWED TO COME WITH YOU AND STAY IN THE WAITING ROOM ONLY DURING PRE OP AND PROCEDURE.   **NO VISITORS ARE ALLOWED IN THE SHORT STAY AREA OR RECOVERY ROOM!!**  IF YOU WILL BE ADMITTED INTO THE HOSPITAL YOU ARE ALLOWED ONLY FOUR SUPPORT PEOPLE DURING VISITATION HOURS ONLY (7 AM -8PM)   The support person(s) must pass our screening, gel in and out, and wear a mask at all times, including in the patient's room. Patients must also wear a mask when staff or their support person are in the room. Visitors GUEST BADGE MUST BE WORN VISIBLY  One adult visitor may remain with you overnight and MUST be in the room by 8 P.M.     Your procedure is scheduled on: 07/08/23   Report to [facility] Main Entrance    Report to admitting at : 11:15 AM   Call this number if you have problems the morning of surgery [phone]   Do not eat food :After Midnight.   After Midnight you may have the following liquids until : 10:45 AM DAY OF SURGERY  Water Black Coffee (sugar ok, NO MILK/CREAM OR CREAMERS)  Tea (sugar ok, NO MILK/CREAM OR CREAMERS) regular and decaf                             Plain Jell-O (NO RED)                                           Fruit ices (not with fruit pulp, NO RED)                                     Popsicles (NO RED)                                                                  Juice: apple, WHITE grape, WHITE cranberry Sports drinks like Gatorade (NO RED)   The day of surgery:  Drink ONE (1) Pre-Surgery Clear Ensure at : 10:45 AM the morning of surgery. Drink in one sitting. Do not sip.  This drink was given to you during your hospital  pre-op appointment visit. Nothing else to drink after completing the  Pre-Surgery Clear Ensure or G2.          If you have questions, please contact your surgeon's office.  FOLLOW ANY ADDITIONAL PRE OP INSTRUCTIONS YOU RECEIVED FROM YOUR SURGEON'S OFFICE!!!   Oral  Hygiene is also important to reduce your risk of infection.                                    Remember - BRUSH YOUR TEETH THE MORNING OF SURGERY WITH YOUR REGULAR TOOTHPASTE  DENTURES WILL BE REMOVED PRIOR TO SURGERY PLEASE DO NOT APPLY "Poly grip" OR ADHESIVES!!!   Do NOT smoke after Midnight   Take these medicines the morning of surgery  with A SIP OF WATER: Seroquel.  DO NOT TAKE ANY ORAL DIABETIC MEDICATIONS DAY OF YOUR SURGERY                              You may not have any metal on your body including hair pins, jewelry, and body piercing             Do not wear make-up, lotions, powders, perfumes/cologne, or deodorant  Do not wear nail polish including gel and S&S, artificial/acrylic nails, or any other type of covering on natural nails including finger and toenails. If you have artificial nails, gel coating, etc. that needs to be removed by a nail salon please have this removed prior to surgery or surgery may need to be canceled/ delayed if the surgeon/ anesthesia feels like they are unable to be safely monitored.   Do not shave  48 hours prior to surgery.    Do not bring valuables to the hospital. [facility] IS NOT             RESPONSIBLE   FOR VALUABLES.   Contacts, glasses, or bridgework may not be worn into surgery.   Bring small overnight bag day of surgery.   DO NOT BRING YOUR HOME MEDICATIONS TO THE HOSPITAL. PHARMACY WILL DISPENSE MEDICATIONS LISTED ON YOUR MEDICATION LIST TO YOU DURING YOUR ADMISSION IN THE HOSPITAL!    Patients discharged on the day of surgery will not be allowed to drive home.  Someone NEEDS to stay with you for the first 24 hours after anesthesia.   Special Instructions: Bring a copy of your healthcare power of attorney and living will documents         the day of surgery if you haven't scanned them before.              Please read over the following fact sheets you were given: IF YOU HAVE QUESTIONS ABOUT YOUR PRE-OP INSTRUCTIONS PLEASE CALL  [phone]    [facility] - Preparing for Surgery Before surgery, you can play an important role.  Because skin is not sterile, your skin needs to be as free of germs as possible.  You can reduce the number of germs on your skin by washing with CHG (chlorahexidine gluconate) soap before surgery.  CHG is an antiseptic cleaner which kills germs and bonds with the skin to continue killing germs even after washing. Please DO NOT use if you have an allergy to CHG or antibacterial soaps.  If your skin becomes reddened/irritated stop using the CHG and inform your nurse when you arrive at Short Stay. Do not shave (including legs and underarms) for at least 48 hours prior to the first CHG shower.  You may shave your face/neck. Please follow these instructions carefully:  1.  Shower with CHG Soap the night before surgery and the  morning of Surgery.  2.  If you choose to wash your hair, wash your hair first as usual with your  normal  shampoo.  3.  After you shampoo, rinse your hair and body thoroughly to remove the  shampoo.                           4.  Use CHG as you would any other liquid soap.  You can apply chg directly  to the skin and wash  Gently with a scrungie or clean washcloth.  5.  Apply the CHG Soap to your body ONLY FROM THE NECK DOWN.   Do not use on face/ open                           Wound or open sores. Avoid contact with eyes, ears mouth and genitals (private parts).                       Wash face,  Genitals (private parts) with your normal soap.             6.  Wash thoroughly, paying special attention to the area where your surgery  will be performed.  7.  Thoroughly rinse your body with warm water from the neck down.  8.  DO NOT shower/wash with your normal soap after using and rinsing off  the CHG Soap.                9.  Pat yourself dry with a clean towel.            10.  Wear clean pajamas.            11.  Place clean sheets on your bed the night of your  first shower and do not  sleep with pets. Day of Surgery : Do not apply any lotions/deodorants the morning of surgery.  Please wear clean clothes to the hospital/surgery center.  FAILURE TO FOLLOW THESE INSTRUCTIONS MAY RESULT IN THE CANCELLATION OF YOUR SURGERY PATIENT SIGNATURE_________________________________  NURSE SIGNATURE__________________________________  ________________________________________________________________________

## 2023-07-04 NOTE — Progress Notes (Signed)
Pt. Needs orders for upcoming surgery. ?

## 2023-07-06 ENCOUNTER — Encounter: Payer: Self-pay | Admitting: Family Medicine

## 2023-07-07 ENCOUNTER — Encounter (HOSPITAL_COMMUNITY)
Admission: RE | Admit: 2023-07-07 | Discharge: 2023-07-07 | Disposition: A | Discharge status: Home / Self Care | Payer: Medicare Other | Source: Ambulatory Visit | Attending: Orthopedic Surgery | Admitting: Orthopedic Surgery

## 2023-07-07 ENCOUNTER — Other Ambulatory Visit: Payer: Self-pay

## 2023-07-07 ENCOUNTER — Encounter (HOSPITAL_COMMUNITY): Payer: Self-pay

## 2023-07-07 VITALS — BP 155/74 | HR 78 | Temp 97.7°F | Ht 62.0 in | Wt 126.0 lb

## 2023-07-07 DIAGNOSIS — Z9011 Acquired absence of right breast and nipple: Secondary | ICD-10-CM | POA: Diagnosis not present

## 2023-07-07 DIAGNOSIS — F0390 Unspecified dementia without behavioral disturbance: Secondary | ICD-10-CM | POA: Diagnosis not present

## 2023-07-07 DIAGNOSIS — R9431 Abnormal electrocardiogram [ECG] [EKG]: Secondary | ICD-10-CM | POA: Diagnosis not present

## 2023-07-07 DIAGNOSIS — H42 Glaucoma in diseases classified elsewhere: Secondary | ICD-10-CM | POA: Diagnosis not present

## 2023-07-07 DIAGNOSIS — W19XXXA Unspecified fall, initial encounter: Secondary | ICD-10-CM | POA: Diagnosis present

## 2023-07-07 DIAGNOSIS — Z880 Allergy status to penicillin: Secondary | ICD-10-CM | POA: Diagnosis not present

## 2023-07-07 DIAGNOSIS — E1139 Type 2 diabetes mellitus with other diabetic ophthalmic complication: Secondary | ICD-10-CM | POA: Diagnosis not present

## 2023-07-07 DIAGNOSIS — F32A Depression, unspecified: Secondary | ICD-10-CM | POA: Diagnosis not present

## 2023-07-07 DIAGNOSIS — M1611 Unilateral primary osteoarthritis, right hip: Secondary | ICD-10-CM | POA: Diagnosis not present

## 2023-07-07 DIAGNOSIS — E119 Type 2 diabetes mellitus without complications: Secondary | ICD-10-CM | POA: Diagnosis not present

## 2023-07-07 DIAGNOSIS — Z79899 Other long term (current) drug therapy: Secondary | ICD-10-CM | POA: Diagnosis not present

## 2023-07-07 DIAGNOSIS — S72011A Unspecified intracapsular fracture of right femur, initial encounter for closed fracture: Secondary | ICD-10-CM | POA: Diagnosis not present

## 2023-07-07 DIAGNOSIS — Z01818 Encounter for other preprocedural examination: Secondary | ICD-10-CM | POA: Diagnosis not present

## 2023-07-07 DIAGNOSIS — R4189 Other symptoms and signs involving cognitive functions and awareness: Secondary | ICD-10-CM | POA: Diagnosis not present

## 2023-07-07 DIAGNOSIS — Z96641 Presence of right artificial hip joint: Secondary | ICD-10-CM | POA: Diagnosis not present

## 2023-07-07 DIAGNOSIS — Z888 Allergy status to other drugs, medicaments and biological substances status: Secondary | ICD-10-CM | POA: Diagnosis not present

## 2023-07-07 DIAGNOSIS — Z881 Allergy status to other antibiotic agents status: Secondary | ICD-10-CM | POA: Diagnosis not present

## 2023-07-07 DIAGNOSIS — Z853 Personal history of malignant neoplasm of breast: Secondary | ICD-10-CM | POA: Diagnosis not present

## 2023-07-07 DIAGNOSIS — Z801 Family history of malignant neoplasm of trachea, bronchus and lung: Secondary | ICD-10-CM | POA: Diagnosis not present

## 2023-07-07 DIAGNOSIS — Z8042 Family history of malignant neoplasm of prostate: Secondary | ICD-10-CM | POA: Diagnosis not present

## 2023-07-07 DIAGNOSIS — D72820 Lymphocytosis (symptomatic): Secondary | ICD-10-CM

## 2023-07-07 DIAGNOSIS — S72041A Displaced fracture of base of neck of right femur, initial encounter for closed fracture: Secondary | ICD-10-CM | POA: Diagnosis not present

## 2023-07-07 DIAGNOSIS — Z471 Aftercare following joint replacement surgery: Secondary | ICD-10-CM | POA: Diagnosis not present

## 2023-07-07 DIAGNOSIS — Z7984 Long term (current) use of oral hypoglycemic drugs: Secondary | ICD-10-CM | POA: Diagnosis not present

## 2023-07-07 DIAGNOSIS — S72001A Fracture of unspecified part of neck of right femur, initial encounter for closed fracture: Secondary | ICD-10-CM | POA: Diagnosis not present

## 2023-07-07 DIAGNOSIS — H409 Unspecified glaucoma: Secondary | ICD-10-CM | POA: Diagnosis not present

## 2023-07-07 HISTORY — DX: Type 2 diabetes mellitus without complications: E11.9

## 2023-07-07 HISTORY — DX: Unspecified osteoarthritis, unspecified site: M19.90

## 2023-07-07 LAB — HEMOGLOBIN A1C
Hgb A1c MFr Bld: 6.5 % — ABNORMAL HIGH (ref 4.8–5.6)
Mean Plasma Glucose: 139.85 mg/dL

## 2023-07-07 LAB — CBC
HCT: 40.6 % (ref 36.0–46.0)
Hemoglobin: 13 g/dL (ref 12.0–15.0)
MCH: 29.3 pg (ref 26.0–34.0)
MCHC: 32 g/dL (ref 30.0–36.0)
MCV: 91.4 fL (ref 80.0–100.0)
Platelets: 445 10*3/uL — ABNORMAL HIGH (ref 150–400)
RBC: 4.44 MIL/uL (ref 3.87–5.11)
RDW: 14.3 % (ref 11.5–15.5)
WBC: 11.8 10*3/uL — ABNORMAL HIGH (ref 4.0–10.5)
nRBC: 0 % (ref 0.0–0.2)

## 2023-07-07 LAB — SURGICAL PCR SCREEN
MRSA, PCR: NEGATIVE
Staphylococcus aureus: NEGATIVE

## 2023-07-07 NOTE — Progress Notes (Signed)
For Anesthesia: PCP - Emilio Aspen, MD  Cardiologist - N/A  Bowel Prep reminder:  Chest x-ray -  EKG - 07/07/23 Stress Test -  ECHO -  Cardiac Cath -  Pacemaker/ICD device last checked: Pacemaker orders received: Device Rep notified:  Spinal Cord Stimulator:N/A  Sleep Study - N/A CPAP -   Fasting Blood Sugar - N/A Checks Blood Sugar ___0__ times a day Date and result of last Hgb A1c-  Last dose of GLP1 agonist- N/A GLP1 instructions:   Last dose of SGLT-2 inhibitors- N/A SGLT-2 instructions:   Blood Thinner Instructions:N/A Aspirin Instructions: Last Dose:  Activity level: Can go up a flight of stairs and activities of daily living without stopping and without chest pain and/or shortness of breath   Able to exercise without chest pain and/or shortness of breath   Unable to go up a flight of stairs without chest pain and/or    Anesthesia review: Hx: DIA.  Patient denies shortness of breath, fever, cough and chest pain at PAT appointment   Patient verbalized understanding of instructions that were given to them at the PAT appointment. Patient was also instructed that they will need to review over the PAT instructions again at home before surgery.

## 2023-07-08 ENCOUNTER — Ambulatory Visit (HOSPITAL_COMMUNITY): Payer: Medicare Other

## 2023-07-08 ENCOUNTER — Other Ambulatory Visit: Payer: Self-pay

## 2023-07-08 ENCOUNTER — Ambulatory Visit (HOSPITAL_COMMUNITY): Payer: Medicare Other | Admitting: Anesthesiology

## 2023-07-08 ENCOUNTER — Inpatient Hospital Stay (HOSPITAL_COMMUNITY)
Admission: AD | Admit: 2023-07-08 | Discharge: 2023-07-11 | DRG: 522 | Disposition: A | Discharge status: Home Health | Payer: Medicare Other | Attending: Orthopedic Surgery | Admitting: Orthopedic Surgery

## 2023-07-08 ENCOUNTER — Encounter (HOSPITAL_COMMUNITY): Payer: Self-pay | Admitting: Orthopedic Surgery

## 2023-07-08 ENCOUNTER — Encounter (HOSPITAL_COMMUNITY)
Admission: AD | Disposition: A | Discharge status: Home / Self Care | Payer: Self-pay | Source: Home / Self Care | Attending: Orthopedic Surgery

## 2023-07-08 ENCOUNTER — Observation Stay (HOSPITAL_COMMUNITY): Payer: Medicare Other

## 2023-07-08 DIAGNOSIS — Z01818 Encounter for other preprocedural examination: Secondary | ICD-10-CM

## 2023-07-08 DIAGNOSIS — E1139 Type 2 diabetes mellitus with other diabetic ophthalmic complication: Secondary | ICD-10-CM | POA: Diagnosis present

## 2023-07-08 DIAGNOSIS — E119 Type 2 diabetes mellitus without complications: Secondary | ICD-10-CM

## 2023-07-08 DIAGNOSIS — S72001A Fracture of unspecified part of neck of right femur, initial encounter for closed fracture: Secondary | ICD-10-CM | POA: Diagnosis not present

## 2023-07-08 DIAGNOSIS — W19XXXA Unspecified fall, initial encounter: Secondary | ICD-10-CM | POA: Diagnosis present

## 2023-07-08 DIAGNOSIS — S72011A Unspecified intracapsular fracture of right femur, initial encounter for closed fracture: Principal | ICD-10-CM | POA: Diagnosis present

## 2023-07-08 DIAGNOSIS — M1611 Unilateral primary osteoarthritis, right hip: Secondary | ICD-10-CM | POA: Diagnosis present

## 2023-07-08 DIAGNOSIS — Z471 Aftercare following joint replacement surgery: Secondary | ICD-10-CM | POA: Diagnosis not present

## 2023-07-08 DIAGNOSIS — R9431 Abnormal electrocardiogram [ECG] [EKG]: Secondary | ICD-10-CM | POA: Diagnosis present

## 2023-07-08 DIAGNOSIS — Z79899 Other long term (current) drug therapy: Secondary | ICD-10-CM

## 2023-07-08 DIAGNOSIS — F32A Depression, unspecified: Secondary | ICD-10-CM | POA: Diagnosis present

## 2023-07-08 DIAGNOSIS — S72041A Displaced fracture of base of neck of right femur, initial encounter for closed fracture: Secondary | ICD-10-CM | POA: Diagnosis not present

## 2023-07-08 DIAGNOSIS — H409 Unspecified glaucoma: Secondary | ICD-10-CM | POA: Diagnosis present

## 2023-07-08 DIAGNOSIS — Z801 Family history of malignant neoplasm of trachea, bronchus and lung: Secondary | ICD-10-CM

## 2023-07-08 DIAGNOSIS — Z880 Allergy status to penicillin: Secondary | ICD-10-CM

## 2023-07-08 DIAGNOSIS — Z888 Allergy status to other drugs, medicaments and biological substances status: Secondary | ICD-10-CM

## 2023-07-08 DIAGNOSIS — Z96641 Presence of right artificial hip joint: Principal | ICD-10-CM

## 2023-07-08 DIAGNOSIS — Z881 Allergy status to other antibiotic agents status: Secondary | ICD-10-CM

## 2023-07-08 DIAGNOSIS — Z853 Personal history of malignant neoplasm of breast: Secondary | ICD-10-CM

## 2023-07-08 DIAGNOSIS — R4189 Other symptoms and signs involving cognitive functions and awareness: Secondary | ICD-10-CM | POA: Diagnosis present

## 2023-07-08 DIAGNOSIS — Z9011 Acquired absence of right breast and nipple: Secondary | ICD-10-CM

## 2023-07-08 DIAGNOSIS — Z8042 Family history of malignant neoplasm of prostate: Secondary | ICD-10-CM

## 2023-07-08 DIAGNOSIS — F0390 Unspecified dementia without behavioral disturbance: Secondary | ICD-10-CM | POA: Diagnosis present

## 2023-07-08 DIAGNOSIS — Z7984 Long term (current) use of oral hypoglycemic drugs: Secondary | ICD-10-CM

## 2023-07-08 DIAGNOSIS — H42 Glaucoma in diseases classified elsewhere: Secondary | ICD-10-CM | POA: Diagnosis present

## 2023-07-08 HISTORY — PX: TOTAL HIP ARTHROPLASTY: SHX124

## 2023-07-08 LAB — ABO/RH: ABO/RH(D): O POS

## 2023-07-08 LAB — BASIC METABOLIC PANEL
Anion gap: 12 (ref 5–15)
BUN: 15 mg/dL (ref 8–23)
CO2: 22 mmol/L (ref 22–32)
Calcium: 9.3 mg/dL (ref 8.9–10.3)
Chloride: 101 mmol/L (ref 98–111)
Creatinine, Ser: 0.86 mg/dL (ref 0.44–1.00)
GFR, Estimated: >60 mL/min (ref >60)
Glucose, Bld: 169 mg/dL — ABNORMAL HIGH (ref 70–99)
Potassium: 3.8 mmol/L (ref 3.5–5.1)
Sodium: 135 mmol/L (ref 135–145)

## 2023-07-08 LAB — TYPE AND SCREEN
ABO/RH(D): O POS
ABO/RH(D): O POS
Antibody Screen: NEGATIVE
Antibody Screen: NEGATIVE

## 2023-07-08 LAB — GLUCOSE, CAPILLARY
Glucose-Capillary: 130 mg/dL — ABNORMAL HIGH (ref 70–99)
Glucose-Capillary: 198 mg/dL — ABNORMAL HIGH (ref 70–99)
Glucose-Capillary: 352 mg/dL — ABNORMAL HIGH (ref 70–99)

## 2023-07-08 SURGERY — ARTHROPLASTY, HIP, TOTAL, ANTERIOR APPROACH
Anesthesia: Monitor Anesthesia Care | Site: Hip | Laterality: Right

## 2023-07-08 MED ORDER — ACETAMINOPHEN 10 MG/ML IV SOLN: 1000.0000 mg | Freq: Once | INTRAVENOUS | Status: DC | PRN

## 2023-07-08 MED ORDER — LIDOCAINE HCL (PF) 2 % IJ SOLN
INTRAMUSCULAR | Status: AC
  Filled 2023-07-08: qty 5

## 2023-07-08 MED ORDER — POVIDONE-IODINE 10 % EX SWAB: 2.0000 | Freq: Once | CUTANEOUS | Status: DC

## 2023-07-08 MED ORDER — SODIUM CHLORIDE (PF) 0.9 % IJ SOLN
INTRAMUSCULAR | Status: AC
  Filled 2023-07-08: qty 50

## 2023-07-08 MED ORDER — CEFAZOLIN SODIUM-DEXTROSE 2-4 GM/100ML-% IV SOLN: 2.0000 g | Freq: Once | INTRAVENOUS | Status: DC

## 2023-07-08 MED ORDER — CEFAZOLIN SODIUM-DEXTROSE 2-4 GM/100ML-% IV SOLN
INTRAVENOUS | Status: AC
  Filled 2023-07-08: qty 100

## 2023-07-08 MED ORDER — ONDANSETRON HCL 4 MG/2ML IJ SOLN
INTRAMUSCULAR | Status: AC
Start: 2023-07-08 — End: ?
  Filled 2023-07-08: qty 2

## 2023-07-08 MED ORDER — DIPHENHYDRAMINE HCL 12.5 MG/5ML PO ELIX
12.5000 mg | ORAL_SOLUTION | ORAL | Status: DC | PRN
  Administered 2023-07-08: 25 mg via ORAL
  Filled 2023-07-08: qty 10

## 2023-07-08 MED ORDER — KETOROLAC TROMETHAMINE 30 MG/ML IJ SOLN
INTRAMUSCULAR | Status: AC
  Filled 2023-07-08: qty 1

## 2023-07-08 MED ORDER — ORAL CARE MOUTH RINSE: 15.0000 mL | Freq: Once | OROMUCOSAL | Status: AC

## 2023-07-08 MED ORDER — CHLORHEXIDINE GLUCONATE 4 % EX SOLN: 60.0000 mL | Freq: Once | CUTANEOUS | Status: DC

## 2023-07-08 MED ORDER — METHOCARBAMOL 1000 MG/10ML IJ SOLN: 500.0000 mg | Freq: Four times a day (QID) | INTRAMUSCULAR | Status: DC | PRN

## 2023-07-08 MED ORDER — DONEPEZIL HCL 10 MG PO TABS
10.0000 mg | ORAL_TABLET | Freq: Every day | ORAL | Status: DC
  Administered 2023-07-08 – 2023-07-10 (×3): 10 mg via ORAL
  Filled 2023-07-08 (×3): qty 1

## 2023-07-08 MED ORDER — BISACODYL 10 MG RE SUPP: 10.0000 mg | Freq: Every day | RECTAL | Status: DC | PRN

## 2023-07-08 MED ORDER — GLIPIZIDE ER 5 MG PO TB24
10.0000 mg | ORAL_TABLET | Freq: Every day | ORAL | Status: DC
Start: 2023-07-09 — End: 2023-07-11
  Administered 2023-07-09 – 2023-07-11 (×3): 10 mg via ORAL
  Filled 2023-07-08 (×3): qty 2

## 2023-07-08 MED ORDER — CEFAZOLIN SODIUM-DEXTROSE 2-4 GM/100ML-% IV SOLN
2.0000 g | INTRAVENOUS | Status: AC
  Administered 2023-07-08: 2 g via INTRAVENOUS

## 2023-07-08 MED ORDER — TRANEXAMIC ACID-NACL 1000-0.7 MG/100ML-% IV SOLN
1000.0000 mg | INTRAVENOUS | Status: AC
  Administered 2023-07-08: 1000 mg via INTRAVENOUS

## 2023-07-08 MED ORDER — PHENOL 1.4 % MT LIQD: 1.0000 | OROMUCOSAL | Status: DC | PRN

## 2023-07-08 MED ORDER — TRANEXAMIC ACID-NACL 1000-0.7 MG/100ML-% IV SOLN
1000.0000 mg | Freq: Once | INTRAVENOUS | Status: AC
  Administered 2023-07-08: 1000 mg via INTRAVENOUS
  Filled 2023-07-08: qty 100

## 2023-07-08 MED ORDER — TRANEXAMIC ACID-NACL 1000-0.7 MG/100ML-% IV SOLN
INTRAVENOUS | Status: AC
  Filled 2023-07-08: qty 100

## 2023-07-08 MED ORDER — STERILE WATER FOR IRRIGATION IR SOLN
Status: DC | PRN
  Administered 2023-07-08: 1000 mL

## 2023-07-08 MED ORDER — ALUM & MAG HYDROXIDE-SIMETH 200-200-20 MG/5ML PO SUSP
30.0000 mL | ORAL | Status: DC | PRN
Start: 2023-07-08 — End: 2023-07-11

## 2023-07-08 MED ORDER — ONDANSETRON HCL 4 MG PO TABS: 4.0000 mg | ORAL_TABLET | Freq: Four times a day (QID) | ORAL | Status: DC | PRN

## 2023-07-08 MED ORDER — BUPIVACAINE HCL (PF) 0.25 % IJ SOLN
INTRAMUSCULAR | Status: AC
  Filled 2023-07-08: qty 30

## 2023-07-08 MED ORDER — ONDANSETRON HCL 4 MG/2ML IJ SOLN: 4.0000 mg | Freq: Four times a day (QID) | INTRAMUSCULAR | Status: DC | PRN

## 2023-07-08 MED ORDER — ACETAMINOPHEN 500 MG PO TABS
1000.0000 mg | ORAL_TABLET | Freq: Four times a day (QID) | ORAL | Status: DC
  Administered 2023-07-08 – 2023-07-11 (×11): 1000 mg via ORAL
  Filled 2023-07-08 (×12): qty 2

## 2023-07-08 MED ORDER — DEXAMETHASONE SODIUM PHOSPHATE 10 MG/ML IJ SOLN
INTRAMUSCULAR | Status: AC
  Filled 2023-07-08: qty 1

## 2023-07-08 MED ORDER — QUETIAPINE FUMARATE 25 MG PO TABS
25.0000 mg | ORAL_TABLET | Freq: Every evening | ORAL | Status: DC | PRN
  Administered 2023-07-08 – 2023-07-10 (×3): 25 mg via ORAL
  Filled 2023-07-08 (×3): qty 1

## 2023-07-08 MED ORDER — BUPIVACAINE IN DEXTROSE 0.75-8.25 % IT SOLN
INTRATHECAL | Status: DC | PRN
  Administered 2023-07-08: 1.4 mL via INTRATHECAL

## 2023-07-08 MED ORDER — INSULIN ASPART 100 UNIT/ML IJ SOLN: 0.0000 [IU] | INTRAMUSCULAR | Status: DC | PRN

## 2023-07-08 MED ORDER — BUPIVACAINE-EPINEPHRINE (PF) 0.25% -1:200000 IJ SOLN
INTRAMUSCULAR | Status: DC | PRN
  Administered 2023-07-08: 30 mL via PERINEURAL

## 2023-07-08 MED ORDER — OXYCODONE HCL 5 MG PO TABS: 2.5000 mg | ORAL_TABLET | ORAL | Status: DC | PRN

## 2023-07-08 MED ORDER — HYDROMORPHONE HCL 1 MG/ML IJ SOLN: 0.5000 mg | INTRAMUSCULAR | Status: DC | PRN

## 2023-07-08 MED ORDER — PHENYLEPHRINE HCL-NACL 20-0.9 MG/250ML-% IV SOLN
INTRAVENOUS | Status: DC | PRN
  Administered 2023-07-08: 20 ug/min via INTRAVENOUS

## 2023-07-08 MED ORDER — PROPOFOL 500 MG/50ML IV EMUL
INTRAVENOUS | Status: DC | PRN
  Administered 2023-07-08: 75 ug/kg/min via INTRAVENOUS

## 2023-07-08 MED ORDER — SENNA 8.6 MG PO TABS
2.0000 | ORAL_TABLET | Freq: Every day | ORAL | Status: DC
  Administered 2023-07-08 – 2023-07-10 (×3): 17.2 mg via ORAL
  Filled 2023-07-08 (×3): qty 2

## 2023-07-08 MED ORDER — PROPOFOL 1000 MG/100ML IV EMUL
INTRAVENOUS | Status: AC
  Filled 2023-07-08: qty 100

## 2023-07-08 MED ORDER — INSULIN ASPART 100 UNIT/ML IJ SOLN
0.0000 [IU] | Freq: Three times a day (TID) | INTRAMUSCULAR | Status: DC
  Administered 2023-07-08 – 2023-07-09 (×2): 3 [IU] via SUBCUTANEOUS
  Administered 2023-07-09 (×2): 5 [IU] via SUBCUTANEOUS
  Administered 2023-07-10 (×2): 3 [IU] via SUBCUTANEOUS

## 2023-07-08 MED ORDER — 0.9 % SODIUM CHLORIDE (POUR BTL) OPTIME
TOPICAL | Status: DC | PRN
  Administered 2023-07-08: 1000 mL

## 2023-07-08 MED ORDER — ASPIRIN 81 MG PO CHEW
81.0000 mg | CHEWABLE_TABLET | Freq: Two times a day (BID) | ORAL | Status: DC
  Administered 2023-07-08 – 2023-07-11 (×6): 81 mg via ORAL
  Filled 2023-07-08 (×6): qty 1

## 2023-07-08 MED ORDER — FENTANYL CITRATE PF 50 MCG/ML IJ SOSY: 25.0000 ug | PREFILLED_SYRINGE | INTRAMUSCULAR | Status: DC | PRN

## 2023-07-08 MED ORDER — CEFAZOLIN SODIUM-DEXTROSE 2-4 GM/100ML-% IV SOLN
2.0000 g | Freq: Four times a day (QID) | INTRAVENOUS | Status: AC
  Administered 2023-07-08 – 2023-07-09 (×2): 2 g via INTRAVENOUS
  Filled 2023-07-08 (×2): qty 100

## 2023-07-08 MED ORDER — LINAGLIPTIN 5 MG PO TABS
5.0000 mg | ORAL_TABLET | Freq: Every day | ORAL | Status: DC
Start: 2023-07-09 — End: 2023-07-11
  Administered 2023-07-09 – 2023-07-11 (×3): 5 mg via ORAL
  Filled 2023-07-08 (×3): qty 1

## 2023-07-08 MED ORDER — SODIUM CHLORIDE 0.9% FLUSH
10.0000 mL | Freq: Two times a day (BID) | INTRAVENOUS | Status: DC
  Administered 2023-07-08 – 2023-07-09 (×2): 10 mL via INTRAVENOUS

## 2023-07-08 MED ORDER — PROPOFOL 10 MG/ML IV BOLUS
INTRAVENOUS | Status: DC | PRN
  Administered 2023-07-08: 20 mg via INTRAVENOUS

## 2023-07-08 MED ORDER — LIDOCAINE HCL (CARDIAC) PF 100 MG/5ML IV SOSY
PREFILLED_SYRINGE | INTRAVENOUS | Status: DC | PRN
  Administered 2023-07-08: 50 mg via INTRAVENOUS

## 2023-07-08 MED ORDER — METFORMIN HCL 500 MG PO TABS
1000.0000 mg | ORAL_TABLET | Freq: Two times a day (BID) | ORAL | Status: DC
  Administered 2023-07-09 – 2023-07-11 (×5): 1000 mg via ORAL
  Filled 2023-07-08 (×5): qty 2

## 2023-07-08 MED ORDER — POLYETHYLENE GLYCOL 3350 17 G PO PACK
17.0000 g | PACK | Freq: Two times a day (BID) | ORAL | Status: DC
  Administered 2023-07-08 – 2023-07-11 (×6): 17 g via ORAL
  Filled 2023-07-08 (×6): qty 1

## 2023-07-08 MED ORDER — SODIUM CHLORIDE (PF) 0.9 % IJ SOLN
INTRAMUSCULAR | Status: DC | PRN
  Administered 2023-07-08: 30 mL

## 2023-07-08 MED ORDER — DEXAMETHASONE SODIUM PHOSPHATE 10 MG/ML IJ SOLN
10.0000 mg | Freq: Once | INTRAMUSCULAR | Status: AC
  Administered 2023-07-09: 10 mg via INTRAVENOUS
  Filled 2023-07-08: qty 1

## 2023-07-08 MED ORDER — KETOROLAC TROMETHAMINE 30 MG/ML IJ SOLN
INTRAMUSCULAR | Status: DC | PRN
  Administered 2023-07-08: 30 mg via INTRAVENOUS

## 2023-07-08 MED ORDER — SITAGLIP PHOS-METFORMIN HCL ER 50-1000 MG PO TB24: 1.0000 | ORAL_TABLET | Freq: Two times a day (BID) | ORAL | Status: DC

## 2023-07-08 MED ORDER — ONDANSETRON HCL 4 MG/2ML IJ SOLN
INTRAMUSCULAR | Status: DC | PRN
  Administered 2023-07-08: 4 mg via INTRAVENOUS

## 2023-07-08 MED ORDER — METOCLOPRAMIDE HCL 5 MG PO TABS: 5.0000 mg | ORAL_TABLET | Freq: Three times a day (TID) | ORAL | Status: DC | PRN

## 2023-07-08 MED ORDER — MENTHOL 3 MG MT LOZG: 1.0000 | LOZENGE | OROMUCOSAL | Status: DC | PRN

## 2023-07-08 MED ORDER — BUPIVACAINE-EPINEPHRINE 0.25% -1:200000 IJ SOLN
INTRAMUSCULAR | Status: AC
  Filled 2023-07-08: qty 1

## 2023-07-08 MED ORDER — OXYCODONE HCL 5 MG PO TABS
5.0000 mg | ORAL_TABLET | ORAL | Status: DC | PRN
  Administered 2023-07-09: 5 mg via ORAL
  Filled 2023-07-08 (×2): qty 1

## 2023-07-08 MED ORDER — METHOCARBAMOL 500 MG PO TABS
500.0000 mg | ORAL_TABLET | Freq: Four times a day (QID) | ORAL | Status: DC | PRN
  Administered 2023-07-08: 500 mg via ORAL
  Filled 2023-07-08: qty 1

## 2023-07-08 MED ORDER — DEXAMETHASONE SODIUM PHOSPHATE 10 MG/ML IJ SOLN
8.0000 mg | Freq: Once | INTRAMUSCULAR | Status: AC
  Administered 2023-07-08: 5 mg via INTRAVENOUS

## 2023-07-08 MED ORDER — METOCLOPRAMIDE HCL 5 MG/ML IJ SOLN
5.0000 mg | Freq: Three times a day (TID) | INTRAMUSCULAR | Status: DC | PRN
Start: 2023-07-08 — End: 2023-07-11

## 2023-07-08 MED ORDER — TRANEXAMIC ACID-NACL 1000-0.7 MG/100ML-% IV SOLN: 1000.0000 mg | INTRAVENOUS | Status: DC

## 2023-07-08 MED ORDER — DONEPEZIL HCL 10 MG PO TBDP
10.0000 mg | ORAL_TABLET | Freq: Every day | ORAL | Status: DC
Start: 2023-07-08 — End: 2023-07-08

## 2023-07-08 MED ORDER — CHLORHEXIDINE GLUCONATE 0.12 % MT SOLN
15.0000 mL | Freq: Once | OROMUCOSAL | Status: AC
Start: 2023-07-08 — End: 2023-07-08
  Administered 2023-07-08: 15 mL via OROMUCOSAL

## 2023-07-08 MED ORDER — LACTATED RINGERS IV SOLN: INTRAVENOUS | Status: DC

## 2023-07-08 SURGICAL SUPPLY — 38 items
BAG COUNTER SPONGE SURGICOUNT (BAG) IMPLANT
BAG ZIPLOCK 12X15 (MISCELLANEOUS) IMPLANT
BLADE SAG 18X100X1.27 (BLADE) ×1 IMPLANT
COVER PERINEAL POST (MISCELLANEOUS) ×1 IMPLANT
COVER SURGICAL LIGHT HANDLE (MISCELLANEOUS) ×1 IMPLANT
CUP ACETBLR 52 OD PINNACLE (Hips) IMPLANT
DERMABOND ADVANCED .7 DNX12 (GAUZE/BANDAGES/DRESSINGS) ×1 IMPLANT
DRAPE FOOT SWITCH (DRAPES) ×1 IMPLANT
DRAPE STERI IOBAN 125X83 (DRAPES) ×1 IMPLANT
DRAPE U-SHAPE 47X51 STRL (DRAPES) ×2 IMPLANT
DRESSING AQUACEL AG SP 3.5X10 (GAUZE/BANDAGES/DRESSINGS) ×1 IMPLANT
DRSG AQUACEL AG SP 3.5X10 (GAUZE/BANDAGES/DRESSINGS) ×1 IMPLANT
DURAPREP 26ML APPLICATOR (WOUND CARE) ×1 IMPLANT
ELECT REM PT RETURN 15FT ADLT (MISCELLANEOUS) ×1 IMPLANT
FEM STEM 12/14 TAPER SZ 4 HIP (Orthopedic Implant) ×1 IMPLANT
FEMORAL STEM 12/14 TPR SZ4 HIP (Orthopedic Implant) IMPLANT
GLOVE BIO SURGEON STRL SZ 6 (GLOVE) ×1 IMPLANT
GLOVE BIOGEL PI IND STRL 6.5 (GLOVE) ×1 IMPLANT
GLOVE BIOGEL PI IND STRL 7.5 (GLOVE) ×1 IMPLANT
GLOVE ORTHO TXT STRL SZ7.5 (GLOVE) ×2 IMPLANT
GOWN STRL REUS W/ TWL LRG LVL3 (GOWN DISPOSABLE) ×2 IMPLANT
HEAD M SROM 36MM PLUS 1.5 (Hips) IMPLANT
HOLDER FOLEY CATH W/STRAP (MISCELLANEOUS) ×1 IMPLANT
KIT TURNOVER KIT A (KITS) IMPLANT
LINER NEUTRAL 52X36MM PLUS 4 (Liner) IMPLANT
NDL SAFETY ECLIPSE 18X1.5 (NEEDLE) IMPLANT
PACK ANTERIOR HIP CUSTOM (KITS) ×1 IMPLANT
SCREW 6.5MMX25MM (Screw) IMPLANT
SROM M HEAD 36MM PLUS 1.5 (Hips) ×1 IMPLANT
SUT MNCRL AB 4-0 PS2 18 (SUTURE) ×1 IMPLANT
SUT STRATAFIX 0 PDS 27 VIOLET (SUTURE) ×1 IMPLANT
SUT VIC AB 1 CT1 36 (SUTURE) ×3 IMPLANT
SUT VIC AB 2-0 CT1 TAPERPNT 27 (SUTURE) ×2 IMPLANT
SUTURE STRATFX 0 PDS 27 VIOLET (SUTURE) ×1 IMPLANT
SYR 3ML LL SCALE MARK (SYRINGE) IMPLANT
TRAY FOLEY MTR SLVR 16FR STAT (SET/KITS/TRAYS/PACK) IMPLANT
TUBE SUCTION HIGH CAP CLEAR NV (SUCTIONS) ×1 IMPLANT
WATER STERILE IRR 1000ML POUR (IV SOLUTION) ×1 IMPLANT

## 2023-07-08 NOTE — H&P (Signed)
TOTAL HIP ADMISSION H&P  Patient is admitted for right total hip arthroplasty.  Patient with memory impairment (husband tells me she did not recall the meeting with Sherri Garza later that day) Plan for d/c home with HEP  Lives with husband    Subjective:  Chief Complaint: right hip pain  HPI: Sherri Garza, 82 y.o. female, has a history of memory impairment. She presented to our office acutely for hip pain and abnormal gait after a fall 2 weeks prior. Radiographs showed chronic femoral neck fracture. We discussed plans for total hip arthroplasty with Sherri Garza.   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   Current Outpatient Medications  Medication Sig Dispense Refill Last Dose   acetaminophen (TYLENOL) 650 MG CR tablet Take 650 mg by mouth 2 (two) times daily.      donepezil (ARICEPT ODT) 10 MG disintegrating tablet Take 1 tablet (10 mg total) by mouth at bedtime. 30 tablet 2    glipiZIDE (GLUCOTROL XL) 10 MG 24 hr tablet Take 10 mg by mouth daily.      lactase (LACTAID) 3000 units tablet Take 3,000 Units by mouth 2 (two) times daily.      Multiple Vitamin (MULTIVITAMIN) tablet Take 1 tablet by mouth daily.      QUEtiapine (SEROQUEL) 25 MG tablet Take 25 mg by mouth at bedtime as needed (Sleep).      SitaGLIPtin-MetFORMIN HCl (JANUMET XR) 50-1000 MG TB24 Take 1 tablet by mouth 2 (two) times daily.      meloxicam (MOBIC) 15 MG  tablet Take 15 mg by mouth daily. (Patient not taking: Reported on 07/07/2023)   Not Taking   memantine (NAMENDA) 10 MG tablet Take 10 mg by mouth 2 (two) times daily. (Patient not taking: Reported on 07/07/2023)   Not Taking   QUEtiapine (SEROQUEL XR) 50 MG TB24 24 hr tablet Take 50 mg by mouth at bedtime as needed (Sleep). (Patient not taking: Reported on 07/07/2023)   Not Taking   Allergies  Allergen Reactions   Prevagen [Apoaequorin] Anaphylaxis   Bacitracin    Ciprofloxacin    Doxycycline    Januvia [Sitagliptin]    Metformin And Related Diarrhea   Penicillins    Tetanus Toxoids Rash    Social History   Tobacco Use   Smoking status: Never   Smokeless tobacco: Never  Substance Use Topics   Alcohol use: Not Currently    Family History  Problem Relation Age of Onset   Cancer Mother    Cancer Father    Prostate cancer Father    Cancer Brother    Lung cancer Brother      Review of Systems  Constitutional:  Negative for chills and fever.  Respiratory:  Negative for cough and shortness of breath.   Cardiovascular:  Negative for  chest pain.  Gastrointestinal:  Negative for nausea and vomiting.  Musculoskeletal:  Positive for arthralgias.     Objective:  Physical Exam Constitutional:      Appearance: Normal appearance.  HENT:     Head: Normocephalic.  Neurological:     Mental Status: She is alert.   RLE: No pain with gentle hip ROM. No tenderness to palpation. Distal sensation and pulses intact.    Vital signs in last 24 hours: Temp:  [97.7 F (36.5 C)] 97.7 F (36.5 C) (12/09 1021) Pulse Rate:  [78] 78 (12/09 1021) BP: (155)/(74) 155/74 (12/09 1021) SpO2:  [98 %] 98 % (12/09 1021) Weight:  [57.2 kg] 57.2 kg (12/09 1021)  Labs:   Estimated body mass index is 23.05 kg/m as calculated from the following:   Height as of 07/07/23: 5\' 2"  (1.575 m).   Weight as of 07/07/23: 57.2 kg.   Imaging Review 2 views of the right hip AP and frog leg lateral personally  ordered and reviewed revealing chronic fracture of the femoral neck With elevation of the femur.     MRI report available to me shows 1. Chronic fracture of the right femoral head with chronic motion and eburnation between the fracture fragments and associated marrow edema. 2. Small right hip joint effusion and proliferative tissue likely degenerative/posttraumatic in nature. Although not completely excluded, infection/osteomyelitis of the right hip would be very unlikely given this appearance. Clinical and/or laboratory correlation can be obtained as indicated. 3. Myositis and partial tears of the gluteus medius and adductor muscles about the right hip with apparent previous avulsion of the femoral insertion of the foot is femoris with small fracture fragment and probable small hematoma. 4. Moderate to severe degenerative disc and moderate degenerative facet change of the lower lumbar spine.      Assessment/Plan:  Chronic right femoral neck fracture   The patient history, physical examination, clinical judgement of the provider and imaging studies are consistent with femoral neck fracture of the right hip(s) and total hip arthroplasty is deemed medically necessary. The treatment options including medical management, injection therapy, arthroscopy and arthroplasty were discussed at length. The risks and benefits of total hip arthroplasty were presented and reviewed. The risks due to aseptic loosening, infection, stiffness, dislocation/subluxation,  thromboembolic complications and other imponderables were discussed.  The patient acknowledged the explanation, agreed to proceed with the plan and consent was signed. Patient is being admitted for inpatient treatment for surgery, pain control, PT, OT, prophylactic antibiotics, VTE prophylaxis, progressive ambulation and ADL's and discharge planning.The patient is planning to be discharged  home.   Rosalene Billings, PA-C Orthopedic  Surgery [facility] [place] [phone]

## 2023-07-08 NOTE — Discharge Instructions (Addendum)
INSTRUCTIONS AFTER JOINT REPLACEMENT   Pain Regimen Instructions:  - Take tylenol 1000 mg every 6-8 hours around the clock  - Take the muscle relaxant if she has pain despite tylenol  - Ice and elevate your leg as often as you can  Do not take over the counter pain medication until instructed by [place].  If this is not controlling your pain, or you need refills - call our office at [phone] or send a message via the [employer] portal.   Take the stool softeners provided until you are having regular bowel movements, and then you may discontinue.    Remove items at home which could result in a fall. This includes throw rugs or furniture in walking pathways ICE to the affected joint every three hours while awake for 30 minutes at a time, for at least the first 3-5 days, and then as needed for pain and swelling.  Continue to use ice for pain and swelling. You may notice swelling that will progress down to the foot and ankle.  This is normal after surgery.  Elevate your leg when you are not up walking on it.   Continue to use the breathing machine you got in the hospital (incentive spirometer) which will help keep your temperature down.  It is common for your temperature to cycle up and down following surgery, especially at night when you are not up moving around and exerting yourself.  The breathing machine keeps your lungs expanded and your temperature down.   DIET:  As you were doing prior to hospitalization, we recommend a well-balanced diet.  DRESSING / WOUND CARE / SHOWERING  Keep the surgical dressing until follow up.  The dressing is water proof, so you can shower without any extra covering.  IF THE DRESSING FALLS OFF or the wound gets wet inside, change the dressing with sterile gauze.  Please use good hand washing techniques before changing the dressing.  Do not use any lotions or creams on the incision until instructed by your surgeon.    ACTIVITY  Increase activity slowly as tolerated,  but follow the weight bearing instructions below.   No driving for 6 weeks or until further direction given by your physician.  You cannot drive while taking narcotics.  No lifting or carrying greater than 10 lbs. until further directed by your surgeon. Avoid periods of inactivity such as sitting longer than an hour when not asleep. This helps prevent blood clots.  You may return to work once you are authorized by your doctor.     WEIGHT BEARING   Weight bearing as tolerated with assist device (walker, cane, etc) as directed, use it as long as suggested by your surgeon or therapist, typically at least 4-6 weeks.   EXERCISES  Results after joint replacement surgery are often greatly improved when you follow the exercise, range of motion and muscle strengthening exercises prescribed by your doctor. Safety measures are also important to protect the joint from further injury. Any time any of these exercises cause you to have increased pain or swelling, decrease what you are doing until you are comfortable again and then slowly increase them. If you have problems or questions, call your caregiver or physical therapist for advice.   Rehabilitation is important following a joint replacement. After just a few days of immobilization, the muscles of the leg can become weakened and shrink (atrophy).  These exercises are designed to build up the tone and strength of the thigh and leg muscles and  to improve motion. Often times heat used for twenty to thirty minutes before working out will loosen up your tissues and help with improving the range of motion but do not use heat for the first two weeks following surgery (sometimes heat can increase post-operative swelling).   These exercises can be done on a training (exercise) mat, on the floor, on a table or on a bed. Use whatever works the best and is most comfortable for you.    Use music or television while you are exercising so that the exercises are a  pleasant break in your day. This will make your life better with the exercises acting as a break in your routine that you can look forward to.   Perform all exercises about fifteen times, three times per day or as directed.  You should exercise both the operative leg and the other leg as well.  Exercises include:   Quad Sets - Tighten up the muscle on the front of the thigh (Quad) and hold for 5-10 seconds.   Straight Leg Raises - With your knee straight (if you were given a brace, keep it on), lift the leg to 60 degrees, hold for 3 seconds, and slowly lower the leg.  Perform this exercise against resistance later as your leg gets stronger.  Leg Slides: Lying on your back, slowly slide your foot toward your buttocks, bending your knee up off the floor (only go as far as is comfortable). Then slowly slide your foot back down until your leg is flat on the floor again.  Angel Wings: Lying on your back spread your legs to the side as far apart as you can without causing discomfort.  Hamstring Strength:  Lying on your back, push your heel against the floor with your leg straight by tightening up the muscles of your buttocks.  Repeat, but this time bend your knee to a comfortable angle, and push your heel against the floor.  You may put a pillow under the heel to make it more comfortable if necessary.   A rehabilitation program following joint replacement surgery can speed recovery and prevent re-injury in the future due to weakened muscles. Contact your doctor or a physical therapist for more information on knee rehabilitation.    CONSTIPATION  Constipation is defined medically as fewer than three stools per week and severe constipation as less than one stool per week.  Even if you have a regular bowel pattern at home, your normal regimen is likely to be disrupted due to multiple reasons following surgery.  Combination of anesthesia, postoperative narcotics, change in appetite and fluid intake all can  affect your bowels.   YOU MUST use at least one of the following options; they are listed in order of increasing strength to get the job done.  They are all available over the counter, and you may need to use some, POSSIBLY even all of these options:    Drink plenty of fluids (prune juice may be helpful) and high fiber foods Colace 100 mg by mouth twice a day  Senokot for constipation as directed and as needed Dulcolax (bisacodyl), take with full glass of water  Miralax (polyethylene glycol) once or twice a day as needed.  If you have tried all these things and are unable to have a bowel movement in the first 3-4 days after surgery call either your surgeon or your primary doctor.    If you experience loose stools or diarrhea, hold the medications until you stool forms  back up.  If your symptoms do not get better within 1 week or if they get worse, check with your doctor.  If you experience "the worst abdominal pain ever" or develop nausea or vomiting, please contact the office immediately for further recommendations for treatment.   ITCHING:  If you experience itching with your medications, try taking only a single pain pill, or even half a pain pill at a time.  You can also use Benadryl over the counter for itching or also to help with sleep.   TED HOSE STOCKINGS:  Use stockings on both legs until for at least 2 weeks or as directed by physician office. They may be removed at night for sleeping.  MEDICATIONS:  See your medication summary on the "After Visit Summary" that nursing will review with you.  You may have some home medications which will be placed on hold until you complete the course of blood thinner medication.  It is important for you to complete the blood thinner medication as prescribed.  PRECAUTIONS:  If you experience chest pain or shortness of breath - call 911 immediately for transfer to the hospital emergency department.   If you develop a fever greater that 101 F, purulent  drainage from wound, increased redness or drainage from wound, foul odor from the wound/dressing, or calf pain - CONTACT YOUR SURGEON.                                                   FOLLOW-UP APPOINTMENTS:  If you do not already have a post-op appointment, please call the office for an appointment to be seen by your surgeon.  Guidelines for how soon to be seen are listed in your "After Visit Summary", but are typically between 1-4 weeks after surgery.  OTHER INSTRUCTIONS:   Knee Replacement:  Do not place pillow under knee, focus on keeping the knee straight while resting. CPM instructions: 0-90 degrees, 2 hours in the morning, 2 hours in the afternoon, and 2 hours in the evening. Place foam block, curve side up under heel at all times except when in CPM or when walking.  DO NOT modify, tear, cut, or change the foam block in any way.  POST-OPERATIVE OPIOID TAPER INSTRUCTIONS: It is important to wean off of your opioid medication as soon as possible. If you do not need pain medication after your surgery it is ok to stop day one. Opioids include: Codeine, Hydrocodone(Norco, Vicodin), Oxycodone(Percocet, oxycontin) and hydromorphone amongst others.  Long term and even short term use of opiods can cause: Increased pain response Dependence Constipation Depression Respiratory depression And more.  Withdrawal symptoms can include Flu like symptoms Nausea, vomiting And more Techniques to manage these symptoms Hydrate well Eat regular healthy meals Stay active Use relaxation techniques(deep breathing, meditating, yoga) Do Not substitute Alcohol to help with tapering If you have been on opioids for less than two weeks and do not have pain than it is ok to stop all together.  Plan to wean off of opioids This plan should start within one week post op of your joint replacement. Maintain the same interval or time between taking each dose and first decrease the dose.  Cut the total daily intake  of opioids by one tablet each day Next start to increase the time between doses. The last dose that should be eliminated  is the evening dose.   MAKE SURE YOU:  Understand these instructions.  Get help right away if you are not doing well or get worse.    Thank you for letting us be a part of your medical care team.  It is a privilege we respect greatly.  We hope these instructions will help you stay on track for a fast and full recovery!

## 2023-07-08 NOTE — Anesthesia Preprocedure Evaluation (Addendum)
Anesthesia Evaluation  Patient identified by MRN, date of birth, ID band Patient awake    Reviewed: Allergy & Precautions, NPO status , Patient's Chart, lab work & pertinent test results  Airway Mallampati: II  TM Distance: >3 FB Neck ROM: Full    Dental no notable dental hx.    Pulmonary neg pulmonary ROS   Pulmonary exam normal        Cardiovascular negative cardio ROS  Rhythm:Regular Rate:Normal     Neuro/Psych    Depression       GI/Hepatic negative GI ROS, Neg liver ROS,,,  Endo/Other  diabetes, Type 2, Oral Hypoglycemic Agents    Renal/GU   negative genitourinary   Musculoskeletal  (+) Arthritis , Osteoarthritis,    Abdominal Normal abdominal exam  (+)   Peds  Hematology Lab Results      Component                Value               Date                      WBC                      11.8 (H)            07/07/2023                HGB                      13.0                07/07/2023                HCT                      40.6                07/07/2023                MCV                      91.4                07/07/2023                PLT                      445 (H)             07/07/2023             Lab Results      Component                Value               Date                      NA                       135                 07/08/2023                K  3.8                 07/08/2023                CO2                      22                  07/08/2023                GLUCOSE                  169 (H)             07/08/2023                BUN                      15                  07/08/2023                CREATININE               0.86                07/08/2023                CALCIUM                  9.3                 07/08/2023                GFRNONAA                 >60                 07/08/2023              Anesthesia Other Findings   Reproductive/Obstetrics                              Anesthesia Physical Anesthesia Plan  ASA: 3  Anesthesia Plan: MAC   Post-op Pain Management:    Induction: Intravenous  PONV Risk Score and Plan: 2 and Ondansetron, Dexamethasone, Propofol infusion and Treatment may vary due to age or medical condition  Airway Management Planned: Simple Face Mask and Nasal Cannula  Additional Equipment: None  Intra-op Plan:   Post-operative Plan:   Informed Consent: I have reviewed the patients History and Physical, chart, labs and discussed the procedure including the risks, benefits and alternatives for the proposed anesthesia with the patient or authorized representative who has indicated his/her understanding and acceptance.     Dental advisory given  Plan Discussed with: CRNA  Anesthesia Plan Comments:        Anesthesia Quick Evaluation

## 2023-07-08 NOTE — Interval H&P Note (Signed)
History and Physical Interval Note:  07/08/2023 11:43 AM  Sherri Garza  has presented today for surgery, with the diagnosis of Right femoral neck fracture.  The various methods of treatment have been discussed with the patient and family. After consideration of risks, benefits and other options for treatment, the patient has consented to  Procedure(s): TOTAL HIP ARTHROPLASTY ANTERIOR APPROACH (Right) as a surgical intervention.  The patient's history has been reviewed, patient examined, no change in status, stable for surgery.  I have reviewed the patient's chart and labs.  Questions were answered to the patient's satisfaction.     Shelda Pal

## 2023-07-08 NOTE — Op Note (Signed)
NAME:  Sherri Garza                ACCOUNT NO.: [id number]      MEDICAL RECORD NO.: [id number]      FACILITY:  [facility]      PHYSICIAN:  Shelda Pal  DATE OF BIRTH:  [birth year]     DATE OF PROCEDURE:  07/08/2023                                 OPERATIVE REPORT         PREOPERATIVE DIAGNOSIS: Right hip displaced femoral neck fracture      POSTOPERATIVE DIAGNOSIS:  Right hip displaced femoral neck fracture      PROCEDURE:  Right total hip replacement through an anterior approach   utilizing DePuy THR system, component size 52 mm pinnacle cup, a size 36+4 neutral   Altrex liner, a size 4 Hi Actis stem with a 36+1.5 Articuleze metal head ball.      SURGEON:  Madlyn Frankel. Charlann Boxer, M.D.      ASSISTANT:  Rosalene Billings, PA-C     ANESTHESIA:  Spinal.      SPECIMENS:  None.      COMPLICATIONS:  None.      BLOOD LOSS:  350 cc     DRAINS:  None.      INDICATION OF THE PROCEDURE:  Sherri Garza is a 82 y.o. female who was seen and evaluated the office last week.  Based on reports from her husband due to her significant dementia there may have been a fall within the past 2 months.  She had some mild complaints of pain but was functioning.  She was noted to have shortening through her right lower extremity.  Radiographs in the office based on the nature of her complaints revealed a displaced femoral neck fracture with slight proximal migration of the proximal femur.  The femoral head was located within the acetabulum.  This was noted to be somewhat of a subcapital fracture.  Despite her history of dementia she is functional.  I reviewed with her husband the indications for operative repair.  I recommended total hip arthroplasty to help with pain control but also to maintain her functional level.  There are risks for infection DVT dislocation neurovascular injury further falls that may result in periprosthetic fracture and need for future surgeries.  Surgical consent was  obtained for the management of her right femoral neck fracture.       PROCEDURE IN DETAIL:  The patient was brought to operative theater.   Once adequate anesthesia, preoperative antibiotics, 2 gm of Ancef, 1 gm of Tranexamic Acid, and 10 mg of Decadron were administered, the patient was positioned supine on the [employer] table.  Once the patient was safely positioned with adequate padding of boney prominences we predraped out the hip, and used fluoroscopy to confirm orientation of the pelvis.      The right hip was then prepped and draped from proximal iliac crest to   mid thigh with a shower curtain technique.      Time-out was performed identifying the patient, planned procedure, and the appropriate extremity.     An incision was then made 2 cm lateral to the   anterior superior iliac spine extending over the orientation of the   tensor fascia lata muscle and sharp dissection was carried down to the  fascia of the muscle.      The fascia was then incised.  The muscle belly was identified and swept   laterally and retractor placed along the superior neck.  Following   cauterization of the circumflex vessels and removing some pericapsular   fat, a second cobra retractor was placed on the inferior neck.  A T-capsulotomy was made along the line of the   superior neck to the trochanteric fossa, then extended proximally and   distally.  Tag sutures were placed and the retractors were then placed   intracapsular.  We then identified the trochanteric fossa and   orientation of my neck cut and then made a neck osteotomy with the femur on traction.  The fractured femoral neck segment and the femoral  head were removed without difficulty or complication.  Traction was let   off and retractors were placed posterior and anterior around the   acetabulum.      The labrum and foveal tissue were debrided.  I began reaming with a 46 mm   reamer and reamed up to 51 mm reamer with good bony bed  preparation and a 52 mm  cup was chosen.  The final 52 mm Pinnacle cup was then impacted under fluoroscopy to confirm the depth of penetration and orientation with respect to   Abduction and forward flexion.  A screw was placed into the ilium followed by the hole eliminator.  The final   36+4 neutral Altrex liner was impacted with good visualized rim fit.  The cup was positioned anatomically within the acetabular portion of the pelvis.      At this point, the femur was rolled to 100 degrees.  Further capsule was   released off the inferior aspect of the femoral neck.  I then   released the superior capsule proximally.  With the leg in a neutral position the hook was placed laterally   along the femur under the vastus lateralis origin and elevated manually and then held in position using the hook attachment on the bed.  The leg was then extended and adducted with the leg rolled to 100   degrees of external rotation.  Retractors were placed along the medial calcar and posteriorly over the greater trochanter.  Once the proximal femur was fully   exposed, I used a box osteotome to set orientation.  I then began   broaching with the starting chili pepper broach and passed this by hand and then broached up to 4.  With the 4 broach in place I chose a high offset neck and did several trial reductions.  The offset was appropriate, leg lengths   appeared to be equal best matched with the +1.5 head ball trial confirmed radiographically.   Given these findings, I went ahead and dislocated the hip, repositioned all   retractors and positioned the right hip in the extended and abducted position.  The final 4 Hi Actis stem was   chosen and it was impacted down to the level of neck cut.  Based on this   and the trial reductions, a final 36+1.5 Articuleze metal head ball was chosen and   impacted onto a clean and dry trunnion, and the hip was reduced.  The   hip had been irrigated throughout the case again at this  point.  I did   reapproximate the superior capsular leaflet to the anterior leaflet   using #1 Vicryl.  The fascia of the   tensor fascia lata muscle was  then reapproximated using #1 Vicryl and #0 Stratafix sutures.  The   remaining wound was closed with 2-0 Vicryl and running 4-0 Monocryl.   The hip was cleaned, dried, and dressed sterilely using Dermabond and   Aquacel dressing.  The patient was then brought   to recovery room in stable condition tolerating the procedure well.    Rosalene Billings, PA-C was present for the entirety of the case involved from   preoperative positioning, perioperative retractor management, general   facilitation of the case, as well as primary wound closure as assistant.            Madlyn Frankel Charlann Boxer, M.D.        07/08/2023 1:10 PM

## 2023-07-08 NOTE — Transfer of Care (Signed)
Immediate Anesthesia Transfer of Care Note  Patient: Sherri Garza  Procedure(s) Performed: TOTAL HIP ARTHROPLASTY ANTERIOR APPROACH (Right: Hip)  Patient Location: PACU  Anesthesia Type:Spinal and MAC combined with regional for post-op pain  Level of Consciousness: awake, drowsy, and patient cooperative  Airway & Oxygen Therapy: Patient Spontanous Breathing and Patient connected to face mask oxygen  Post-op Assessment: Report given to RN and Post -op Vital signs reviewed and stable  Post vital signs: Reviewed and stable  Last Vitals:  Vitals Value Taken Time  BP 112/75 07/08/23 1455  Temp    Pulse 60 07/08/23 1457  Resp 18 07/08/23 1457  SpO2 100 % 07/08/23 1457  Vitals shown include unfiled device data.  Last Pain:  Vitals:   07/08/23 1204  TempSrc: Oral  PainSc:       Patients Stated Pain Goal: 5 (07/08/23 1154)  Complications: No notable events documented.

## 2023-07-08 NOTE — Anesthesia Procedure Notes (Signed)
Spinal  Patient location during procedure: OR Start time: 07/08/2023 1:26 PM End time: 07/08/2023 1:28 PM Staffing Performed: anesthesiologist  Anesthesiologist: Atilano Median, DO Performed by: Atilano Median, DO Authorized by: Atilano Median, DO   Preanesthetic Checklist Completed: patient identified, IV checked, site marked, risks and benefits discussed, surgical consent, monitors and equipment checked, pre-op evaluation and timeout performed Spinal Block Patient position: sitting Prep: DuraPrep Patient monitoring: heart rate, cardiac monitor, continuous pulse ox and blood pressure Approach: midline Location: L3-4 Injection technique: single-shot Needle Needle type: Pencan  Needle gauge: 24 G Needle length: 10 cm Assessment Events: CSF return Additional Notes Patient identified. Risks/Benefits/Options discussed with patient including but not limited to bleeding, infection, nerve damage, paralysis, failed block, incomplete pain control, headache, blood pressure changes, nausea, vomiting, reactions to medications, itching and postpartum back pain. Confirmed with bedside nurse the patient's most recent platelet count. Confirmed with patient that they are not currently taking any anticoagulation, have any bleeding history or any family history of bleeding disorders. Patient expressed understanding and wished to proceed. All questions were answered. Sterile technique was used throughout the entire procedure. Please see nursing notes for vital signs. Warning signs of high block given to the patient including shortness of breath, tingling/numbness in hands, complete motor block, or any concerning symptoms with instructions to call for help. Patient was given instructions on fall risk and not to get out of bed. All questions and concerns addressed with instructions to call with any issues or inadequate analgesia.

## 2023-07-08 NOTE — Anesthesia Postprocedure Evaluation (Signed)
Anesthesia Post Note  Patient: Sherri Garza  Procedure(s) Performed: TOTAL HIP ARTHROPLASTY ANTERIOR APPROACH (Right: Hip)     Patient location during evaluation: PACU Anesthesia Type: MAC and Spinal Level of consciousness: awake and alert Pain management: pain level controlled Vital Signs Assessment: post-procedure vital signs reviewed and stable Respiratory status: spontaneous breathing, nonlabored ventilation, respiratory function stable and patient connected to nasal cannula oxygen Cardiovascular status: stable and blood pressure returned to baseline Postop Assessment: no apparent nausea or vomiting Anesthetic complications: no   No notable events documented.  Last Vitals:  Vitals:   07/08/23 1600 07/08/23 1633  BP: 125/65 (!) 127/99  Pulse: (!) 49 (!) 51  Resp: 19 18  Temp: (!) 36.4 C (!) 36.4 C  SpO2: 100% 99%    Last Pain:  Vitals:   07/08/23 1633  TempSrc:   PainSc: 0-No pain                 Earl Lites P Emmylou Bieker

## 2023-07-08 NOTE — Plan of Care (Signed)
  Problem: Activity: Goal: Risk for activity intolerance will decrease Outcome: Progressing   Problem: Pain Management: Goal: General experience of comfort will improve Outcome: Progressing   Problem: Safety: Goal: Ability to remain free from injury will improve Outcome: Progressing

## 2023-07-09 ENCOUNTER — Encounter (HOSPITAL_COMMUNITY): Payer: Self-pay | Admitting: Orthopedic Surgery

## 2023-07-09 DIAGNOSIS — M1611 Unilateral primary osteoarthritis, right hip: Secondary | ICD-10-CM | POA: Diagnosis present

## 2023-07-09 DIAGNOSIS — Z880 Allergy status to penicillin: Secondary | ICD-10-CM | POA: Diagnosis not present

## 2023-07-09 DIAGNOSIS — Z881 Allergy status to other antibiotic agents status: Secondary | ICD-10-CM | POA: Diagnosis not present

## 2023-07-09 DIAGNOSIS — Z9011 Acquired absence of right breast and nipple: Secondary | ICD-10-CM | POA: Diagnosis not present

## 2023-07-09 DIAGNOSIS — H42 Glaucoma in diseases classified elsewhere: Secondary | ICD-10-CM | POA: Diagnosis present

## 2023-07-09 DIAGNOSIS — Z853 Personal history of malignant neoplasm of breast: Secondary | ICD-10-CM | POA: Diagnosis not present

## 2023-07-09 DIAGNOSIS — Z8042 Family history of malignant neoplasm of prostate: Secondary | ICD-10-CM | POA: Diagnosis not present

## 2023-07-09 DIAGNOSIS — Z888 Allergy status to other drugs, medicaments and biological substances status: Secondary | ICD-10-CM | POA: Diagnosis not present

## 2023-07-09 DIAGNOSIS — S72011A Unspecified intracapsular fracture of right femur, initial encounter for closed fracture: Secondary | ICD-10-CM | POA: Diagnosis present

## 2023-07-09 DIAGNOSIS — Z79899 Other long term (current) drug therapy: Secondary | ICD-10-CM | POA: Diagnosis not present

## 2023-07-09 DIAGNOSIS — R4189 Other symptoms and signs involving cognitive functions and awareness: Secondary | ICD-10-CM | POA: Diagnosis present

## 2023-07-09 DIAGNOSIS — F0390 Unspecified dementia without behavioral disturbance: Secondary | ICD-10-CM | POA: Diagnosis present

## 2023-07-09 DIAGNOSIS — Z7984 Long term (current) use of oral hypoglycemic drugs: Secondary | ICD-10-CM | POA: Diagnosis not present

## 2023-07-09 DIAGNOSIS — W19XXXA Unspecified fall, initial encounter: Secondary | ICD-10-CM | POA: Diagnosis present

## 2023-07-09 DIAGNOSIS — E1139 Type 2 diabetes mellitus with other diabetic ophthalmic complication: Secondary | ICD-10-CM | POA: Diagnosis present

## 2023-07-09 DIAGNOSIS — H409 Unspecified glaucoma: Secondary | ICD-10-CM | POA: Diagnosis present

## 2023-07-09 DIAGNOSIS — F32A Depression, unspecified: Secondary | ICD-10-CM | POA: Diagnosis present

## 2023-07-09 DIAGNOSIS — Z01818 Encounter for other preprocedural examination: Secondary | ICD-10-CM | POA: Diagnosis not present

## 2023-07-09 DIAGNOSIS — R9431 Abnormal electrocardiogram [ECG] [EKG]: Secondary | ICD-10-CM | POA: Diagnosis present

## 2023-07-09 DIAGNOSIS — Z801 Family history of malignant neoplasm of trachea, bronchus and lung: Secondary | ICD-10-CM | POA: Diagnosis not present

## 2023-07-09 LAB — BASIC METABOLIC PANEL
Anion gap: 10 (ref 5–15)
BUN: 24 mg/dL — ABNORMAL HIGH (ref 8–23)
CO2: 18 mmol/L — ABNORMAL LOW (ref 22–32)
Calcium: 8.3 mg/dL — ABNORMAL LOW (ref 8.9–10.3)
Chloride: 102 mmol/L (ref 98–111)
Creatinine, Ser: 1.29 mg/dL — ABNORMAL HIGH (ref 0.44–1.00)
GFR, Estimated: 42 mL/min — ABNORMAL LOW (ref >60)
Glucose, Bld: 260 mg/dL — ABNORMAL HIGH (ref 70–99)
Potassium: 4 mmol/L (ref 3.5–5.1)
Sodium: 130 mmol/L — ABNORMAL LOW (ref 135–145)

## 2023-07-09 LAB — GLUCOSE, CAPILLARY
Glucose-Capillary: 222 mg/dL — ABNORMAL HIGH (ref 70–99)
Glucose-Capillary: 228 mg/dL — ABNORMAL HIGH (ref 70–99)
Glucose-Capillary: 210 mg/dL — ABNORMAL HIGH (ref 70–99)
Glucose-Capillary: 193 mg/dL — ABNORMAL HIGH (ref 70–99)
Glucose-Capillary: 218 mg/dL — ABNORMAL HIGH (ref 70–99)

## 2023-07-09 LAB — CBC
HCT: 30.2 % — ABNORMAL LOW (ref 36.0–46.0)
Hemoglobin: 10.1 g/dL — ABNORMAL LOW (ref 12.0–15.0)
MCH: 30.4 pg (ref 26.0–34.0)
MCHC: 33.4 g/dL (ref 30.0–36.0)
MCV: 91 fL (ref 80.0–100.0)
Platelets: 315 10*3/uL (ref 150–400)
RBC: 3.32 MIL/uL — ABNORMAL LOW (ref 3.87–5.11)
RDW: 14.2 % (ref 11.5–15.5)
WBC: 14.8 10*3/uL — ABNORMAL HIGH (ref 4.0–10.5)
nRBC: 0 % (ref 0.0–0.2)

## 2023-07-09 NOTE — Progress Notes (Signed)
   Subjective: 1 Day Post-Op Procedure(s) (LRB): TOTAL HIP ARTHROPLASTY ANTERIOR APPROACH (Right) Patient with history of cognitive impairment / dementia.  Patient seen in rounds by Dr. Charlann Boxer. Patient is resting in bed on exam this morning. No acute events overnight. She was confused overnight.  We will start therapy today.   Objective: Vital signs in last 24 hours: Temp:  [97.4 F (36.3 C)-98.8 F (37.1 C)] 98.8 F (37.1 C) (12/11 0650) Pulse Rate:  [49-88] 88 (12/11 0650) Resp:  [16-23] 16 (12/11 0650) BP: (112-142)/(60-99) 138/82 (12/11 0650) SpO2:  [93 %-100 %] 99 % (12/11 0650) Weight:  [57.2 kg] 57.2 kg (12/10 1154)  Intake/Output from previous day:  Intake/Output Summary (Last 24 hours) at 07/09/2023 0802 Last data filed at 07/09/2023 0300 Gross per 24 hour  Intake 1190 ml  Output 350 ml  Net 840 ml     Intake/Output this shift: No intake/output data recorded.  Labs: Recent Labs    07/07/23 1054 07/09/23 0344  HGB 13.0 10.1*   Recent Labs    07/07/23 1054 07/09/23 0344  WBC 11.8* 14.8*  RBC 4.44 3.32*  HCT 40.6 30.2*  PLT 445* 315   Recent Labs    07/08/23 1140 07/09/23 0344  NA 135 130*  K 3.8 4.0  CL 101 102  CO2 22 18*  BUN 15 24*  CREATININE 0.86 1.29*  GLUCOSE 169* 260*  CALCIUM 9.3 8.3*   No results for input(s): "LABPT", "INR" in the last 72 hours.  Exam: General - Patient is Alert and Confused Extremity - Neurologically intact Intact pulses distally Dorsiflexion/Plantar flexion intact Dressing - dressing C/D/I Motor Function - intact, moving foot and toes well on exam.   Past Medical History:  Diagnosis Date   Arthritis    Bell's palsy    2007 and 2019 brought on by stress   Breast cancer (HCC)    Depression    Diabetes mellitus without complication (HCC)    Glaucoma    IBS (irritable bowel syndrome)    Memory loss    short term   Vitamin D deficiency     Assessment/Plan: 1 Day Post-Op Procedure(s) (LRB): TOTAL  HIP ARTHROPLASTY ANTERIOR APPROACH (Right) Principal Problem:   S/P total right hip arthroplasty  Estimated body mass index is 23.05 kg/m as calculated from the following:   Height as of this encounter: 5\' 2"  (1.575 m).   Weight as of this encounter: 57.2 kg. Advance diet Up with therapy  DVT Prophylaxis - Aspirin Weight bearing as tolerated.  Hgb stable at 10.1 this AM.  Patient with baseline confusion/dementia. Will try to minimize narcotic meds   Plan is to go Home after hospital stay. Plan for possible discharge today if meeting goals with therapy. May require stay until tomorrow. Follow up in the office in 2 weeks.   Rosalene Billings, PA-C Orthopedic Surgery [phone] 07/09/2023, 8:02 AM

## 2023-07-09 NOTE — Plan of Care (Signed)
  Problem: Pain Management: Goal: General experience of comfort will improve 07/09/2023 0748 by Penelope Galas, RN Outcome: Progressing 07/09/2023 0747 by Penelope Galas, RN Outcome: Progressing   Problem: Safety: Goal: Ability to remain free from injury will improve 07/09/2023 0748 by Penelope Galas, RN Outcome: Progressing 07/09/2023 0747 by Penelope Galas, RN Outcome: Progressing   Problem: Activity: Goal: Ability to avoid complications of mobility impairment will improve 07/09/2023 0748 by Penelope Galas, RN Outcome: Progressing 07/09/2023 0747 by Penelope Galas, RN Outcome: Progressing Goal: Ability to tolerate increased activity will improve 07/09/2023 0748 by Penelope Galas, RN Outcome: Progressing 07/09/2023 0747 by Penelope Galas, RN Outcome: Progressing

## 2023-07-09 NOTE — TOC Transition Note (Signed)
Transition of Care [facility]) - CM/SW Discharge Note   Patient Details  Name: Sherri Garza MRN: [id number] Date of Birth: [birth year]  Transition of Care [facility]) CM/SW Contact:  Amada Jupiter, LCSW Phone Number: 07/09/2023, 2:05 PM   Clinical Narrative:     Met with pt/ spouse today and they confirm need for a RW and no DME agency preference.  Order placed with [employer] and delivered to room.  Plan for HEP.  No further TOC needs.  Final next level of care: Home/Self Care Barriers to Discharge: No Barriers Identified   Patient Goals and CMS Choice      Discharge Placement                         Discharge Plan and Services Additional resources added to the After Visit Summary for                  DME Arranged: Walker rolling DME Agency: [employer] Date DME Agency Contacted: 07/09/23 Time DME Agency Contacted: 0930 Representative spoke with at DME Agency: Yvonna Alanis            Social Determinants of Health (SDOH) Interventions SDOH Screenings   Food Insecurity: No Food Insecurity (07/08/2023)  Housing: Low Risk  (07/08/2023)  Transportation Needs: No Transportation Needs (07/08/2023)  Utilities: Not At Risk (07/08/2023)  Alcohol Screen: Low Risk  (03/18/2023)  Depression (PHQ2-9): Low Risk  (03/18/2023)  Social Connections: Unknown (05/02/2023)   Received from [facility]  Tobacco Use: Low Risk  (07/07/2023)     Readmission Risk Interventions     No data to display

## 2023-07-09 NOTE — Care Management Obs Status (Signed)
MEDICARE OBSERVATION STATUS NOTIFICATION   Patient Details  Name: Sherri Garza MRN: [id number] Date of Birth: 27-Oct-1940   Medicare Observation Status Notification Given:  Yes  verbal   Amada Jupiter, LCSW 07/09/2023, 1:57 PM

## 2023-07-09 NOTE — Evaluation (Addendum)
Physical Therapy Evaluation Patient Details Name: Sherri Garza MRN: [id number] DOB: 1940/09/16 Today's Date: 07/09/2023  History of Present Illness  Pt is 82 yo female s/p R anterior THA on 07/07/13.  Pt with hx including but not limited to dementia, arthritis, bells palsy, breast CA, DM, IBS, memory loss (short term)  Clinical Impression  Pt is s/p THA resulting in the deficits listed below (see PT Problem List). Pt unable to give PLOF but reports likes to walk and per chart lives with spouse.  Pt has hx of dementia. Now is confused, only oriented to self, not aware of having surgery, and very short term memory loss (educated multiple x during session on surgery, hospital, etc).  Unknown if this is her baseline.  She was able to stand with min A and ambulated 65' with RW and CGA.  Pt needing cues for safety.  She c/o R hip pain with movement but seemed to ease at rest - RN reports had not given oxycodone due to confusion (spouse aware).  Overall , pt moved well, just limited by cognition and safety. Pt will benefit from acute skilled PT to increase their independence and safety with mobility to facilitate discharge.          If plan is discharge home, recommend the following: A little help with walking and/or transfers;A little help with bathing/dressing/bathroom;Assistance with cooking/housework;Help with stairs or ramp for entrance;Direct supervision/assist for medications management;Assist for transportation;Direct supervision/assist for financial management;Supervision due to cognitive status   Can travel by private vehicle        Equipment Recommendations Rolling walker (2 wheels) (if does not have; pt unable to state, family not present; would benefit from youth RW if needs new one)  Recommendations for Other Services       Functional Status Assessment Patient has had a recent decline in their functional status and demonstrates the ability to make significant improvements in function  in a reasonable and predictable amount of time.     Precautions / Restrictions Precautions Precautions: Fall Restrictions Weight Bearing Restrictions: Yes RLE Weight Bearing: Weight bearing as tolerated      Mobility  Bed Mobility    Supine to Sit with mod A for legs and trunk, increased time               Transfers Overall transfer level: Needs assistance Equipment used: Rolling walker (2 wheels) Transfers: Sit to/from Stand Sit to Stand: Min assist                Ambulation/Gait Ambulation/Gait assistance: Editor, commissioning (Feet): 60 Feet Assistive device: Rolling walker (2 wheels) Gait Pattern/deviations: Step-to pattern, Decreased stride length, Decreased dorsiflexion - right, Decreased weight shift to right Gait velocity: fast     General Gait Details: Pt ambulating on R toes due to pain; fast speed requiring cues to take her time and for her RW management; pt stating "not sure why my hip hurts"  Stairs            Wheelchair Mobility     Tilt Bed    Modified Rankin (Stroke Patients Only)       Balance Overall balance assessment: Needs assistance Sitting-balance support: No upper extremity supported Sitting balance-Leahy Scale: Good     Standing balance support: Bilateral upper extremity supported, Reliant on assistive device for balance Standing balance-Leahy Scale: Poor Standing balance comment: RW and CGA  Pertinent Vitals/Pain Pain Assessment Pain Assessment: Faces Faces Pain Scale: Hurts even more Pain Location: R hip Pain Descriptors / Indicators: Discomfort, Grimacing Pain Intervention(s): Limited activity within patient's tolerance, Monitored during session, Repositioned (RN reports holding on meds due to confusion)    Home Living Family/patient expects to be discharged to:: Private residence Living Arrangements: Spouse/significant other                 Additional  Comments: Pt not able to provide any history/home set up and family not present    Prior Function Prior Level of Function : Patient poor historian/Family not available             Mobility Comments: Pt reports liking to walk in neighborhood       Extremity/Trunk Assessment   Upper Extremity Assessment Upper Extremity Assessment: Overall WFL for tasks assessed    Lower Extremity Assessment Lower Extremity Assessment: RLE deficits/detail;Difficult to assess due to impaired cognition RLE Deficits / Details: ROM WFL; MMT at least 3/5 ankle, 2/5 hip/knee    Cervical / Trunk Assessment Cervical / Trunk Assessment: Normal  Communication      Cognition Arousal: Alert Behavior During Therapy: Anxious Overall Cognitive Status: No family/caregiver present to determine baseline cognitive functioning                                 General Comments: Pt has hx of dementia - but family not present to determine baseline.  She was very pleasant and able to follow commands but no short term memory.  She was only oriented to self  Unaware that she was in hospital or had hip surgery.  Frequently stating "don't know why my R hip hurts" and when told she had surgery "I didn't know they were doing that."  -This occurred multiple times throughout session.        General Comments      Exercises     Assessment/Plan    PT Assessment Patient needs continued PT services  PT Problem List Decreased strength;Decreased range of motion;Decreased activity tolerance;Decreased balance;Decreased mobility;Decreased knowledge of use of DME;Decreased cognition;Pain;Decreased safety awareness;Decreased knowledge of precautions       PT Treatment Interventions DME instruction;Therapeutic exercise;Gait training;Stair training;Functional mobility training;Therapeutic activities;Patient/family education;Balance training;Modalities;Cognitive remediation;Neuromuscular re-education    PT Goals  (Current goals can be found in the Care Plan section)  Acute Rehab PT Goals Patient Stated Goal: not able to state PT Goal Formulation: Patient unable to participate in goal setting Time For Goal Achievement: 07/23/23 Potential to Achieve Goals: Good    Frequency 7X/week     Co-evaluation               AM-PAC PT "6 Clicks" Mobility  Outcome Measure Help needed turning from your back to your side while in a flat bed without using bedrails?: A Little Help needed moving from lying on your back to sitting on the side of a flat bed without using bedrails?: A Little Help needed moving to and from a bed to a chair (including a wheelchair)?: A Little Help needed standing up from a chair using your arms (e.g., wheelchair or bedside chair)?: A Lot (cues.) Help needed to walk in hospital room?: A Lot Help needed climbing 3-5 steps with a railing? : A Lot 6 Click Score: 15    End of Session Equipment Utilized During Treatment: Gait belt Activity Tolerance: Patient tolerated treatment well Patient left: with  chair alarm set;in chair;with call bell/phone within reach Nurse Communication: Mobility status PT Visit Diagnosis: Other abnormalities of gait and mobility (R26.89);Muscle weakness (generalized) (M62.81)    Time: 6962-9528 PT Time Calculation (min) (ACUTE ONLY): 17 min   Charges:   PT Evaluation $PT Eval Low Complexity: 1 Low   PT General Charges $$ ACUTE PT VISIT: 1 Visit         Anise Salvo, PT Acute Rehab Services [facility] [phone]   Rayetta Humphrey 07/09/2023, 1:50 PM

## 2023-07-09 NOTE — Plan of Care (Signed)
  Problem: Coping: Goal: Level of anxiety will decrease Outcome: Progressing   Problem: Safety: Goal: Ability to remain free from injury will improve Outcome: Progressing   Problem: Activity: Goal: Ability to tolerate increased activity will improve Outcome: Progressing   Problem: Pain Management: Goal: Pain level will decrease with appropriate interventions Outcome: Progressing

## 2023-07-09 NOTE — Plan of Care (Signed)
  Problem: Activity: Goal: Risk for activity intolerance will decrease Outcome: Progressing   Problem: Pain Management: Goal: General experience of comfort will improve Outcome: Progressing   Problem: Safety: Goal: Ability to remain free from injury will improve Outcome: Progressing

## 2023-07-09 NOTE — Progress Notes (Signed)
Physical Therapy Treatment Patient Details Name: Sherri Garza MRN: [id number] DOB: 1941/03/20 Today's Date: 07/09/2023   History of Present Illness Pt is 82 yo female s/p R anterior THA on 07/07/13.  Pt with hx including but not limited to dementia, arthritis, bells palsy, breast CA, DM, IBS, memory loss (short term)    PT Comments  Pt similar to am session.  Confused and unaware of having surgery but pleasant and follows commands.  She ambulated 1' with RW and CGA; min/mod A transfer.  Will continue to benefit from therapy. Spouse will need training on how to assist with transfers.    If plan is discharge home, recommend the following: A little help with walking and/or transfers;A little help with bathing/dressing/bathroom;Assistance with cooking/housework;Help with stairs or ramp for entrance;Direct supervision/assist for medications management;Assist for transportation;Direct supervision/assist for financial management;Supervision due to cognitive status   Can travel by private vehicle        Equipment Recommendations  Rolling walker (2 wheels) (youth)    Recommendations for Other Services       Precautions / Restrictions Precautions Precautions: Fall Restrictions Weight Bearing Restrictions: Yes RLE Weight Bearing: Weight bearing as tolerated     Mobility  Bed Mobility Overal bed mobility: Needs Assistance Bed Mobility: Supine to Sit, Sit to Supine     Supine to sit: Mod assist Sit to supine: Mod assist   General bed mobility comments: in chair    Transfers Overall transfer level: Needs assistance Equipment used: Rolling walker (2 wheels) Transfers: Sit to/from Stand Sit to Stand: Min assist           General transfer comment: increased time; cues for hand placement    Ambulation/Gait Ambulation/Gait assistance: Min assist Gait Distance (Feet): 60 Feet Assistive device: Rolling walker (2 wheels) Gait Pattern/deviations: Step-to pattern, Decreased  stride length, Decreased dorsiflexion - right, Decreased weight shift to right Gait velocity: fast     General Gait Details: Pt ambulating on R toes due to pain; fast speed requiring cues to take her time and for her RW management; pt stating "not sure why my hip hurts"   Stairs             Wheelchair Mobility     Tilt Bed    Modified Rankin (Stroke Patients Only)       Balance Overall balance assessment: Needs assistance Sitting-balance support: No upper extremity supported Sitting balance-Leahy Scale: Good     Standing balance support: Bilateral upper extremity supported, Reliant on assistive device for balance Standing balance-Leahy Scale: Poor Standing balance comment: RW and CGA                            Cognition Arousal: Alert Behavior During Therapy: Anxious Overall Cognitive Status: No family/caregiver present to determine baseline cognitive functioning                                 General Comments: Pt has hx of dementia - but family not present to determine baseline.  She was very pleasant and able to follow commands but no short term memory.  She was only oriented to self  Unaware that she was in hospital or had hip surgery.  Frequently stating "don't know why my R hip hurts" and when told she had surgery "I didn't know they were doing that."  -This occurred multiple times throughout session.  Exercises Total Joint Exercises Ankle Circles/Pumps: AROM, Both, 10 reps, Supine Quad Sets: AROM, Both, 10 reps, Supine Heel Slides: AAROM, Right, 10 reps, Supine Hip ABduction/ADduction: AAROM, Right, 10 reps, Supine    General Comments        Pertinent Vitals/Pain Pain Assessment Pain Assessment: Faces Faces Pain Scale: Hurts even more Pain Location: R hip Pain Descriptors / Indicators: Discomfort, Grimacing Pain Intervention(s): Limited activity within patient's tolerance, Monitored during session, Repositioned     Home Living Family/patient expects to be discharged to:: Private residence Living Arrangements: Spouse/significant other                 Additional Comments: Pt not able to provide any history/home set up and family not present    Prior Function            PT Goals (current goals can now be found in the care plan section) Acute Rehab PT Goals Patient Stated Goal: not able to state PT Goal Formulation: Patient unable to participate in goal setting Time For Goal Achievement: 07/23/23 Potential to Achieve Goals: Good Progress towards PT goals: Progressing toward goals    Frequency    7X/week      PT Plan      Co-evaluation              AM-PAC PT "6 Clicks" Mobility   Outcome Measure  Help needed turning from your back to your side while in a flat bed without using bedrails?: A Little Help needed moving from lying on your back to sitting on the side of a flat bed without using bedrails?: A Little Help needed moving to and from a bed to a chair (including a wheelchair)?: A Little Help needed standing up from a chair using your arms (e.g., wheelchair or bedside chair)?: A Lot (cues) Help needed to walk in hospital room?: A Lot Help needed climbing 3-5 steps with a railing? : A Lot 6 Click Score: 15    End of Session Equipment Utilized During Treatment: Gait belt Activity Tolerance: Patient tolerated treatment well Patient left: with call bell/phone within reach;in bed;with bed alarm set;with SCD's reapplied Nurse Communication: Mobility status PT Visit Diagnosis: Other abnormalities of gait and mobility (R26.89);Muscle weakness (generalized) (M62.81)     Time: 4098-1191 PT Time Calculation (min) (ACUTE ONLY): 15 min  Charges:    $Gait Training: 8-22 mins $Therapeutic Activity: 8-22 mins PT General Charges $$ ACUTE PT VISIT: 1 Visit                     Anise Salvo, PT Acute Rehab [facility] [phone]    Rayetta Humphrey 07/09/2023, 3:42 PM

## 2023-07-09 NOTE — Plan of Care (Signed)
  Problem: Pain Management: Goal: General experience of comfort will improve Outcome: Progressing   Problem: Safety: Goal: Ability to remain free from injury will improve Outcome: Progressing   Problem: Coping: Goal: Ability to adjust to condition or change in health will improve Outcome: Progressing   Problem: Skin Integrity: Goal: Risk for impaired skin integrity will decrease Outcome: Progressing

## 2023-07-10 LAB — CBC
HCT: 30.8 % — ABNORMAL LOW (ref 36.0–46.0)
Hemoglobin: 10 g/dL — ABNORMAL LOW (ref 12.0–15.0)
MCH: 29.7 pg (ref 26.0–34.0)
MCHC: 32.5 g/dL (ref 30.0–36.0)
MCV: 91.4 fL (ref 80.0–100.0)
Platelets: 325 10*3/uL (ref 150–400)
RBC: 3.37 MIL/uL — ABNORMAL LOW (ref 3.87–5.11)
RDW: 14.6 % (ref 11.5–15.5)
WBC: 12.9 10*3/uL — ABNORMAL HIGH (ref 4.0–10.5)
nRBC: 0 % (ref 0.0–0.2)

## 2023-07-10 LAB — GLUCOSE, CAPILLARY
Glucose-Capillary: 156 mg/dL — ABNORMAL HIGH (ref 70–99)
Glucose-Capillary: 153 mg/dL — ABNORMAL HIGH (ref 70–99)
Glucose-Capillary: 98 mg/dL (ref 70–99)
Glucose-Capillary: 88 mg/dL (ref 70–99)

## 2023-07-10 NOTE — Progress Notes (Signed)
Patients spouse requested for labs to be drawn after 8 am, notified the lab team.

## 2023-07-10 NOTE — Progress Notes (Signed)
Physical Therapy Treatment Patient Details Name: Sherri Garza MRN: [id number] DOB: November 14, 1940 Today's Date: 07/10/2023   History of Present Illness Pt is 82 yo female s/p R anterior THA on 07/07/13.  Pt with hx including but not limited to dementia, arthritis, bells palsy, breast CA, DM, IBS, memory loss (short term)    PT Comments  Pt continues very cooperative, mildly anxious and with min carry over from previous sessions 2* dementia related cognitive deficits.  Pt performed HEP with assist and up to ambulate increased distance in [place] but with noted instability requiring cueing for safety awareness most notable to slow pace.    If plan is discharge home, recommend the following: A little help with walking and/or transfers;A little help with bathing/dressing/bathroom;Assistance with cooking/housework;Help with stairs or ramp for entrance;Direct supervision/assist for medications management;Assist for transportation;Direct supervision/assist for financial management;Supervision due to cognitive status   Can travel by private vehicle        Equipment Recommendations  Rolling walker (2 wheels)    Recommendations for Other Services       Precautions / Restrictions Precautions Precautions: Fall Restrictions Weight Bearing Restrictions Per Provider Order: No RLE Weight Bearing Per Provider Order: Weight bearing as tolerated     Mobility  Bed Mobility Overal bed mobility: Needs Assistance Bed Mobility: Supine to Sit     Supine to sit: Mod assist     General bed mobility comments: increased time with cues for sequence and use of L LE and UEs to self assist    Transfers Overall transfer level: Needs assistance Equipment used: Rolling walker (2 wheels) Transfers: Sit to/from Stand Sit to Stand: Min assist           General transfer comment: increased time; cues for hand placement    Ambulation/Gait Ambulation/Gait assistance: Min assist Gait Distance (Feet): 115  Feet Assistive device: Rolling walker (2 wheels) Gait Pattern/deviations: Step-to pattern, Step-through pattern, Decreased step length - right, Decreased step length - left, Shuffle, Trunk flexed Gait velocity: fast     General Gait Details: Cues for posture, position from RW, to slow pace for safety, and initial sequence   Stairs             Wheelchair Mobility     Tilt Bed    Modified Rankin (Stroke Patients Only)       Balance Overall balance assessment: Needs assistance Sitting-balance support: No upper extremity supported Sitting balance-Leahy Scale: Good     Standing balance support: Bilateral upper extremity supported, Reliant on assistive device for balance Standing balance-Leahy Scale: Poor Standing balance comment: RW and CGA                            Cognition Arousal: Alert Behavior During Therapy: Anxious Overall Cognitive Status: No family/caregiver present to determine baseline cognitive functioning                                 General Comments: Pt has hx of dementia - but family not present to determine baseline.  She was very pleasant and able to follow commands but no short term memory.  She was only oriented to self  Unaware that she was in hospital or had hip surgery.  Frequently stating "don't know why my R hip hurts" and when told she had surgery "I didn't know they were doing that."  -This occurred multiple times throughout  session.        Exercises Total Joint Exercises Ankle Circles/Pumps: AROM, Both, Supine, 15 reps Quad Sets: AROM, Both, 10 reps, Supine Heel Slides: AAROM, Right, Supine, 20 reps Hip ABduction/ADduction: AAROM, Right, Supine, 15 reps    General Comments        Pertinent Vitals/Pain Pain Assessment Pain Assessment: Faces Faces Pain Scale: Hurts little more Pain Location: R hip Pain Descriptors / Indicators: Discomfort, Grimacing Pain Intervention(s): Limited activity within  patient's tolerance, Monitored during session, Premedicated before session, Ice applied    Home Living                          Prior Function            PT Goals (current goals can now be found in the care plan section) Acute Rehab PT Goals Patient Stated Goal: not able to state PT Goal Formulation: Patient unable to participate in goal setting Time For Goal Achievement: 07/23/23 Potential to Achieve Goals: Good Progress towards PT goals: Progressing toward goals    Frequency    7X/week      PT Plan      Co-evaluation              AM-PAC PT "6 Clicks" Mobility   Outcome Measure  Help needed turning from your back to your side while in a flat bed without using bedrails?: A Little Help needed moving from lying on your back to sitting on the side of a flat bed without using bedrails?: A Little Help needed moving to and from a bed to a chair (including a wheelchair)?: A Little Help needed standing up from a chair using your arms (e.g., wheelchair or bedside chair)?: A Little Help needed to walk in hospital room?: A Little Help needed climbing 3-5 steps with a railing? : A Lot 6 Click Score: 17    End of Session Equipment Utilized During Treatment: Gait belt Activity Tolerance: Patient tolerated treatment well Patient left: with call bell/phone within reach;in bed;in chair;with chair alarm set Nurse Communication: Mobility status PT Visit Diagnosis: Other abnormalities of gait and mobility (R26.89);Muscle weakness (generalized) (M62.81)     Time: 4132-4401 PT Time Calculation (min) (ACUTE ONLY): 25 min  Charges:    $Gait Training: 8-22 mins $Therapeutic Exercise: 8-22 mins PT General Charges $$ ACUTE PT VISIT: 1 Visit                     Mauro Kaufmann PT Acute Rehabilitation Services Pager [phone] Office [phone]    Sherri Garza 07/10/2023, 12:18 PM

## 2023-07-10 NOTE — Progress Notes (Signed)
Physical Therapy Treatment Patient Details Name: Sherri Garza MRN: [id number] DOB: [birth year] Today's Date: 07/10/2023   History of Present Illness Pt is 82 yo female s/p R anterior THA on 07/07/13.  Pt with hx including but not limited to dementia, arthritis, bells palsy, breast CA, DM, IBS, memory loss (short term)    PT Comments  Pt continues very cooperative but requiring increased time and cueing for all tasks and with min carry over from prior sessions.  Pt performed therex program with assist and up to ambulate limited distance in [place] with apparent increased pain from am session.  Pt's spouse present and able to provide additional insight into premorbid status.  Hopeful for dc home tomorrow.   If plan is discharge home, recommend the following: A little help with walking and/or transfers;A little help with bathing/dressing/bathroom;Assistance with cooking/housework;Help with stairs or ramp for entrance;Direct supervision/assist for medications management;Assist for transportation;Direct supervision/assist for financial management;Supervision due to cognitive status   Can travel by private vehicle        Equipment Recommendations  Rolling walker (2 wheels)    Recommendations for Other Services       Precautions / Restrictions Precautions Precautions: Fall Restrictions Weight Bearing Restrictions Per Provider Order: No RLE Weight Bearing Per Provider Order: Weight bearing as tolerated     Mobility  Bed Mobility Overal bed mobility: Needs Assistance Bed Mobility: Sit to Supine     Supine to sit: Mod assist Sit to supine: Min assist, Mod assist   General bed mobility comments: Increased time with cuesfor sequence and assist to manage LEs into bed    Transfers Overall transfer level: Needs assistance Equipment used: Rolling walker (2 wheels) Transfers: Sit to/from Stand, Bed to chair/wheelchair/BSC Sit to Stand: Min assist   Step pivot transfers: Min assist        General transfer comment: increased time; cues for hand placement; step pvt recliner to EOB    Ambulation/Gait Ambulation/Gait assistance: Min assist Gait Distance (Feet): 74 Feet Assistive device: Rolling walker (2 wheels) Gait Pattern/deviations: Step-to pattern, Step-through pattern, Decreased step length - right, Decreased step length - left, Shuffle, Trunk flexed Gait velocity: fast     General Gait Details: Cues for posture, position from RW, to slow pace for safety, and initial sequence   Stairs             Wheelchair Mobility     Tilt Bed    Modified Rankin (Stroke Patients Only)       Balance Overall balance assessment: Needs assistance Sitting-balance support: No upper extremity supported Sitting balance-Leahy Scale: Good     Standing balance support: Bilateral upper extremity supported, Reliant on assistive device for balance Standing balance-Leahy Scale: Poor Standing balance comment: RW and CGA                            Cognition Arousal: Alert Behavior During Therapy: Anxious Overall Cognitive Status: History of cognitive impairments - at baseline                                 General Comments: Pt has hx of dementia - but family not present to determine baseline.  She was very pleasant and able to follow commands but no short term memory.  She was only oriented to self  Unaware that she was in hospital or had hip surgery.  Frequently stating "don't  know why my R hip hurts" and when told she had surgery "I didn't know they were doing that."  -This occurred multiple times throughout session.        Exercises Total Joint Exercises Ankle Circles/Pumps: AROM, Both, Supine, 15 reps Quad Sets: AROM, Both, 10 reps, Supine Heel Slides: AAROM, Right, Supine, 20 reps Hip ABduction/ADduction: AAROM, Right, Supine, 15 reps    General Comments        Pertinent Vitals/Pain Pain Assessment Pain Assessment: Faces Faces Pain  Scale: Hurts even more Pain Location: R hip Pain Descriptors / Indicators: Discomfort, Grimacing Pain Intervention(s): Limited activity within patient's tolerance, Monitored during session, Premedicated before session, Ice applied    Home Living                          Prior Function            PT Goals (current goals can now be found in the care plan section) Acute Rehab PT Goals Patient Stated Goal: not able to state PT Goal Formulation: Patient unable to participate in goal setting Time For Goal Achievement: 07/23/23 Potential to Achieve Goals: Good Progress towards PT goals: Progressing toward goals    Frequency    7X/week      PT Plan      Co-evaluation              AM-PAC PT "6 Clicks" Mobility   Outcome Measure  Help needed turning from your back to your side while in a flat bed without using bedrails?: A Little Help needed moving from lying on your back to sitting on the side of a flat bed without using bedrails?: A Little Help needed moving to and from a bed to a chair (including a wheelchair)?: A Little Help needed standing up from a chair using your arms (e.g., wheelchair or bedside chair)?: A Little Help needed to walk in hospital room?: A Little Help needed climbing 3-5 steps with a railing? : A Lot 6 Click Score: 17    End of Session Equipment Utilized During Treatment: Gait belt Activity Tolerance: Patient tolerated treatment well Patient left: with call bell/phone within reach;in bed;in chair;with chair alarm set Nurse Communication: Mobility status PT Visit Diagnosis: Other abnormalities of gait and mobility (R26.89);Muscle weakness (generalized) (M62.81)     Time: 6440-3474 PT Time Calculation (min) (ACUTE ONLY): 41 min  Charges:    $Gait Training: 8-22 mins $Therapeutic Exercise: 8-22 mins $Therapeutic Activity: 8-22 mins PT General Charges $$ ACUTE PT VISIT: 1 Visit                     Mauro Kaufmann PT Acute  Rehabilitation Services Pager [phone] Office [phone]    Jacari Iannello 07/10/2023, 2:50 PM

## 2023-07-10 NOTE — Progress Notes (Signed)
   Subjective: 2 Days Post-Op Procedure(s) (LRB): TOTAL HIP ARTHROPLASTY ANTERIOR APPROACH (Right)  Patient seen in rounds for Dr. Charlann Boxer. Patient is sleeping in bed this morning with her husband sleeping in the recliner. She was sleepy so I mostly spoke with her husband. She did well with PT yesterday. He tells me that he feels she would be better mentally at home, which I agree with.  We will start therapy today.   Objective: Vital signs in last 24 hours: Temp:  [98 F (36.7 C)-98.1 F (36.7 C)] 98 F (36.7 C) (12/12 0512) Pulse Rate:  [64-81] 64 (12/12 0512) Resp:  [15-18] 16 (12/12 0512) BP: (111-143)/(53-74) 143/74 (12/12 0512) SpO2:  [99 %-100 %] 100 % (12/12 0512)  Intake/Output from previous day:  Intake/Output Summary (Last 24 hours) at 07/10/2023 0659 Last data filed at 07/10/2023 0000 Gross per 24 hour  Intake 660 ml  Output --  Net 660 ml     Intake/Output this shift: Total I/O In: 180 [P.O.:180] Out: -   Labs: Recent Labs    07/07/23 1054 07/09/23 0344  HGB 13.0 10.1*   Recent Labs    07/07/23 1054 07/09/23 0344  WBC 11.8* 14.8*  RBC 4.44 3.32*  HCT 40.6 30.2*  PLT 445* 315   Recent Labs    07/08/23 1140 07/09/23 0344  NA 135 130*  K 3.8 4.0  CL 101 102  CO2 22 18*  BUN 15 24*  CREATININE 0.86 1.29*  GLUCOSE 169* 260*  CALCIUM 9.3 8.3*   No results for input(s): "LABPT", "INR" in the last 72 hours.  Exam: General - Patient is Alert and Confused Extremity - Neurologically intact Intact pulses distally Dorsiflexion/Plantar flexion intact Dressing - dressing C/D/I Motor Function - intact, moving foot and toes well on exam.   Past Medical History:  Diagnosis Date   Arthritis    Bell's palsy    2007 and 2019 brought on by stress   Breast cancer (HCC)    Depression    Diabetes mellitus without complication (HCC)    Glaucoma    IBS (irritable bowel syndrome)    Memory loss    short term   Vitamin D deficiency      Assessment/Plan: 2 Days Post-Op Procedure(s) (LRB): TOTAL HIP ARTHROPLASTY ANTERIOR APPROACH (Right) Principal Problem:   S/P total right hip arthroplasty  Estimated body mass index is 23.05 kg/m as calculated from the following:   Height as of this encounter: 5\' 2"  (1.575 m).   Weight as of this encounter: 57.2 kg. Advance diet Up with therapy D/C IV fluids  DVT Prophylaxis - Aspirin Weight bearing as tolerated.  Labs pending She did not take oxycodone all day yesterday and did well so I discussed with husband we will use this on a very limited basis  Plan is to go Home after hospital stay. Plan for discharge today after meeting goals with therapy. Follow up in the office in 2 weeks.   Rosalene Billings, PA-C Orthopedic Surgery [phone] 07/10/2023, 6:59 AM

## 2023-07-10 NOTE — Plan of Care (Signed)
  Problem: Coping: Goal: Level of anxiety will decrease Outcome: Progressing   Problem: Pain Management: Goal: General experience of comfort will improve Outcome: Progressing   Problem: Safety: Goal: Ability to remain free from injury will improve Outcome: Progressing

## 2023-07-10 NOTE — Progress Notes (Signed)
Physical Therapy Treatment Patient Details Name: Sherri Garza MRN: [id number] DOB: June 07, 1941 Today's Date: 07/10/2023   History of Present Illness Pt is 82 yo female s/p R anterior THA on 07/07/13.  Pt with hx including but not limited to dementia, arthritis, bells palsy, breast CA, DM, IBS, memory loss (short term)    PT Comments  Pt with increased anxiety and confusion and requiring increased time and repetition to calm and orient to situation.  Pt requesting to lay down and assisted to bed but with noted increased pain level vs last session - ice applied and RN in room providing pain meds.    If plan is discharge home, recommend the following: A little help with walking and/or transfers;A little help with bathing/dressing/bathroom;Assistance with cooking/housework;Help with stairs or ramp for entrance;Direct supervision/assist for medications management;Assist for transportation;Direct supervision/assist for financial management;Supervision due to cognitive status   Can travel by private vehicle        Equipment Recommendations  Rolling walker (2 wheels)    Recommendations for Other Services       Precautions / Restrictions Precautions Precautions: Fall Restrictions Weight Bearing Restrictions Per Provider Order: No RLE Weight Bearing Per Provider Order: Weight bearing as tolerated     Mobility  Bed Mobility Overal bed mobility: Needs Assistance Bed Mobility: Sit to Supine     Supine to sit: Mod assist Sit to supine: Min assist, Mod assist, +2 for physical assistance, +2 for safety/equipment   General bed mobility comments: Increased time with cues for sequence and assist to manage LEs into bed    Transfers Overall transfer level: Needs assistance Equipment used: Rolling walker (2 wheels) Transfers: Sit to/from Stand, Bed to chair/wheelchair/BSC Sit to Stand: Min assist   Step pivot transfers: Min assist, Mod assist       General transfer comment: increased  time; cues for hand placement; step pvt recliner to EOB    Ambulation/Gait Ambulation/Gait assistance: Min assist Gait Distance (Feet): 74 Feet Assistive device: Rolling walker (2 wheels) Gait Pattern/deviations: Step-to pattern, Step-through pattern, Decreased step length - right, Decreased step length - left, Shuffle, Trunk flexed Gait velocity: fast     General Gait Details: Cues for posture, position from RW, to slow pace for safety, and initial sequence   Stairs             Wheelchair Mobility     Tilt Bed    Modified Rankin (Stroke Patients Only)       Balance Overall balance assessment: Needs assistance Sitting-balance support: No upper extremity supported Sitting balance-Leahy Scale: Good     Standing balance support: Bilateral upper extremity supported, Reliant on assistive device for balance Standing balance-Leahy Scale: Poor Standing balance comment: RW and CGA                            Cognition Arousal: Alert Behavior During Therapy: Anxious Overall Cognitive Status: History of cognitive impairments - at baseline                                 General Comments: Pt has hx of dementia - but family not present to determine baseline.  She was very pleasant and able to follow commands but no short term memory.  She was only oriented to self  Unaware that she was in hospital or had hip surgery.  Frequently stating "don't know why my R  hip hurts" and when told she had surgery "I didn't know they were doing that."  -This occurred multiple times throughout session.        Exercises Total Joint Exercises Ankle Circles/Pumps: AROM, Both, Supine, 15 reps Quad Sets: AROM, Both, 10 reps, Supine Heel Slides: AAROM, Right, Supine, 20 reps Hip ABduction/ADduction: AAROM, Right, Supine, 15 reps    General Comments        Pertinent Vitals/Pain Pain Assessment Pain Assessment: Faces Faces Pain Scale: Hurts whole lot Pain  Location: R hip Pain Descriptors / Indicators: Discomfort, Grimacing, Guarding Pain Intervention(s): Limited activity within patient's tolerance, Monitored during session, RN gave pain meds during session, Ice applied    Home Living                          Prior Function            PT Goals (current goals can now be found in the care plan section) Acute Rehab PT Goals Patient Stated Goal: not able to state PT Goal Formulation: Patient unable to participate in goal setting Time For Goal Achievement: 07/23/23 Potential to Achieve Goals: Good Progress towards PT goals: Progressing toward goals    Frequency    7X/week      PT Plan      Co-evaluation              AM-PAC PT "6 Clicks" Mobility   Outcome Measure  Help needed turning from your back to your side while in a flat bed without using bedrails?: A Little Help needed moving from lying on your back to sitting on the side of a flat bed without using bedrails?: A Little Help needed moving to and from a bed to a chair (including a wheelchair)?: A Little Help needed standing up from a chair using your arms (e.g., wheelchair or bedside chair)?: A Little Help needed to walk in hospital room?: A Little Help needed climbing 3-5 steps with a railing? : A Lot 6 Click Score: 17    End of Session Equipment Utilized During Treatment: Gait belt Activity Tolerance: Other (comment) (anxiety, confusion)   Nurse Communication: Mobility status;Other (comment) PT Visit Diagnosis: Other abnormalities of gait and mobility (R26.89);Muscle weakness (generalized) (M62.81)     Time: 5284-1324 PT Time Calculation (min) (ACUTE ONLY): 30 min  Charges:    $Therapeutic Activity: 8-22 mins PT General Charges $$ ACUTE PT VISIT: 1 Visit                     Mauro Kaufmann PT Acute Rehabilitation Services Pager [phone] Office [phone]    [facility] 07/10/2023, 6:34 PM

## 2023-07-11 LAB — GLUCOSE, CAPILLARY
Glucose-Capillary: 129 mg/dL — ABNORMAL HIGH (ref 70–99)
Glucose-Capillary: 64 mg/dL — ABNORMAL LOW (ref 70–99)
Glucose-Capillary: 84 mg/dL (ref 70–99)

## 2023-07-11 MED ORDER — SENNA 8.6 MG PO TABS: 2.0000 | ORAL_TABLET | Freq: Every day | ORAL | 0 refills | Status: AC

## 2023-07-11 MED ORDER — POLYETHYLENE GLYCOL 3350 17 G PO PACK: 17.0000 g | PACK | Freq: Two times a day (BID) | ORAL | 0 refills | Status: DC

## 2023-07-11 MED ORDER — METHOCARBAMOL 500 MG PO TABS: 500.0000 mg | ORAL_TABLET | Freq: Three times a day (TID) | ORAL | 0 refills | Status: DC | PRN

## 2023-07-11 MED ORDER — ASPIRIN 81 MG PO CHEW: 81.0000 mg | CHEWABLE_TABLET | Freq: Two times a day (BID) | ORAL | 0 refills | Status: AC

## 2023-07-11 NOTE — Progress Notes (Signed)
Physical Therapy Treatment Patient Details Name: Sherri Garza MRN: [id number] DOB: [birth year] Today's Date: 07/11/2023   History of Present Illness Pt is 82 yo female s/p R anterior THA on 07/07/13.  Pt with hx including but not limited to dementia, arthritis, bells palsy, breast CA, DM, IBS, memory loss (short term)    PT Comments  Pt continues confused but pleasant and very cooperative and up to ambulate in [place] but distance limited by increasingly antalgic gait.    If plan is discharge home, recommend the following: A little help with walking and/or transfers;A little help with bathing/dressing/bathroom;Assistance with cooking/housework;Help with stairs or ramp for entrance;Direct supervision/assist for medications management;Assist for transportation;Direct supervision/assist for financial management;Supervision due to cognitive status   Can travel by private vehicle        Equipment Recommendations  Rolling walker (2 wheels)    Recommendations for Other Services       Precautions / Restrictions Precautions Precautions: Fall Restrictions Weight Bearing Restrictions Per Provider Order: No RLE Weight Bearing Per Provider Order: Weight bearing as tolerated     Mobility  Bed Mobility               General bed mobility comments: Pt up in chair with nursing and returns to same    Transfers Overall transfer level: Needs assistance Equipment used: Rolling walker (2 wheels) Transfers: Sit to/from Stand Sit to Stand: Contact guard assist           General transfer comment: cues for transition position, LE management and use of UEs to self assist    Ambulation/Gait Ambulation/Gait assistance: Min assist Gait Distance (Feet): 80 Feet Assistive device: Rolling walker (2 wheels) Gait Pattern/deviations: Step-to pattern, Decreased step length - right, Decreased step length - left, Shuffle, Trunk flexed, Antalgic       General Gait Details: Cues for posture,  position from RW, and initial sequence.  Increasingly antalgic gait limiting distance   Stairs             Wheelchair Mobility     Tilt Bed    Modified Rankin (Stroke Patients Only)       Balance Overall balance assessment: Needs assistance Sitting-balance support: No upper extremity supported Sitting balance-Leahy Scale: Good     Standing balance support: Single extremity supported Standing balance-Leahy Scale: Fair                              Cognition Arousal: Alert Behavior During Therapy: Anxious Overall Cognitive Status: History of cognitive impairments - at baseline                                 General Comments: Pt has hx of dementia - but family not present to determine baseline.  She was very pleasant and able to follow commands but no short term memory.  She was only oriented to self  Unaware that she was in hospital or had hip surgery.  Frequently stating "don't know why my R hip hurts" and when told she had surgery "I didn't know they were doing that."  -This occurred multiple times throughout session.        Exercises      General Comments        Pertinent Vitals/Pain Pain Assessment Pain Assessment: Faces Faces Pain Scale: Hurts even more Pain Location: R hip Pain Descriptors / Indicators: Discomfort, Grimacing,  Guarding Pain Intervention(s): Limited activity within patient's tolerance, Monitored during session, Premedicated before session (Tylenol only, declines ice packs)    Home Living                          Prior Function            PT Goals (current goals can now be found in the care plan section) Acute Rehab PT Goals Patient Stated Goal: not able to state PT Goal Formulation: Patient unable to participate in goal setting Time For Goal Achievement: 07/23/23 Potential to Achieve Goals: Good Progress towards PT goals: Progressing toward goals    Frequency    7X/week      PT Plan       Co-evaluation              AM-PAC PT "6 Clicks" Mobility   Outcome Measure  Help needed turning from your back to your side while in a flat bed without using bedrails?: A Little Help needed moving from lying on your back to sitting on the side of a flat bed without using bedrails?: A Little Help needed moving to and from a bed to a chair (including a wheelchair)?: A Little Help needed standing up from a chair using your arms (e.g., wheelchair or bedside chair)?: A Little Help needed to walk in hospital room?: A Little Help needed climbing 3-5 steps with a railing? : A Lot 6 Click Score: 17    End of Session Equipment Utilized During Treatment: Gait belt Activity Tolerance: Patient tolerated treatment well;Patient limited by pain Patient left: in chair;with call bell/phone within reach;with chair alarm set Nurse Communication: Mobility status PT Visit Diagnosis: Other abnormalities of gait and mobility (R26.89);Muscle weakness (generalized) (M62.81)     Time: 0865-7846 PT Time Calculation (min) (ACUTE ONLY): 20 min  Charges:    $Gait Training: 8-22 mins PT General Charges $$ ACUTE PT VISIT: 1 Visit                     Mauro Kaufmann PT Acute Rehabilitation Services Pager [phone] Office [phone]    Tomi Grandpre 07/11/2023, 12:25 PM

## 2023-07-11 NOTE — Progress Notes (Signed)
Physical Therapy Treatment Patient Details Name: Sherri Garza MRN: [id number] DOB: 05/26/1941 Today's Date: 07/11/2023   History of Present Illness Pt is 82 yo female s/p R anterior THA on 07/07/13.  Pt with hx including but not limited to dementia, arthritis, bells palsy, breast CA, DM, IBS, memory loss (short term)    PT Comments  Pt very cooperative and with noted improvement in stability and activity tolerance this pm.  Pt up to ambulate increased distance in [place] and performed  bed mobility tasks.  Spouse present and states comfortable with returning home at this level.  HHPT arranged per SW.    If plan is discharge home, recommend the following: A little help with walking and/or transfers;A little help with bathing/dressing/bathroom;Assistance with cooking/housework;Help with stairs or ramp for entrance;Direct supervision/assist for medications management;Assist for transportation;Direct supervision/assist for financial management;Supervision due to cognitive status   Can travel by private vehicle        Equipment Recommendations  Rolling walker (2 wheels)    Recommendations for Other Services       Precautions / Restrictions Precautions Precautions: Fall Restrictions Weight Bearing Restrictions Per Provider Order: No RLE Weight Bearing Per Provider Order: Weight bearing as tolerated     Mobility  Bed Mobility Overal bed mobility: Needs Assistance Bed Mobility: Sit to Supine       Sit to supine: Min assist   General bed mobility comments: cues for sequence with min assist to manage LEs into bed    Transfers Overall transfer level: Needs assistance Equipment used: Rolling walker (2 wheels) Transfers: Sit to/from Stand Sit to Stand: Contact guard assist, Supervision           General transfer comment: cues for transition position, LE management and use of UEs to self assist    Ambulation/Gait Ambulation/Gait assistance: Contact guard assist Gait Distance  (Feet): 140 Feet Assistive device: Rolling walker (2 wheels) Gait Pattern/deviations: Decreased step length - right, Decreased step length - left, Shuffle, Trunk flexed, Antalgic, Step-to pattern, Step-through pattern Gait velocity: appropriate pace     General Gait Details: Cues for posture, position from RW, and initial sequence.   Stairs             Wheelchair Mobility     Tilt Bed    Modified Rankin (Stroke Patients Only)       Balance Overall balance assessment: Needs assistance Sitting-balance support: No upper extremity supported Sitting balance-Leahy Scale: Good     Standing balance support: No upper extremity supported Standing balance-Leahy Scale: Fair                              Cognition Arousal: Alert Behavior During Therapy: Anxious Overall Cognitive Status: History of cognitive impairments - at baseline                                 General Comments: Pt has hx of dementia - but family not present to determine baseline.  She was very pleasant and able to follow commands but no short term memory.  She was only oriented to self  Unaware that she was in hospital or had hip surgery.  Frequently stating "don't know why my R hip hurts" and when told she had surgery "I didn't know they were doing that."  -This occurred multiple times throughout session.        Exercises  General Comments        Pertinent Vitals/Pain Pain Assessment Pain Assessment: Faces Faces Pain Scale: Hurts little more Pain Location: R hip Pain Descriptors / Indicators: Discomfort, Grimacing, Guarding Pain Intervention(s): Limited activity within patient's tolerance, Monitored during session, Premedicated before session    Home Living                          Prior Function            PT Goals (current goals can now be found in the care plan section) Acute Rehab PT Goals Patient Stated Goal: not able to state PT Goal  Formulation: Patient unable to participate in goal setting Time For Goal Achievement: 07/23/23 Potential to Achieve Goals: Good Progress towards PT goals: Progressing toward goals    Frequency    7X/week      PT Plan      Co-evaluation              AM-PAC PT "6 Clicks" Mobility   Outcome Measure  Help needed turning from your back to your side while in a flat bed without using bedrails?: A Little Help needed moving from lying on your back to sitting on the side of a flat bed without using bedrails?: A Little Help needed moving to and from a bed to a chair (including a wheelchair)?: A Little Help needed standing up from a chair using your arms (e.g., wheelchair or bedside chair)?: A Little Help needed to walk in hospital room?: A Little Help needed climbing 3-5 steps with a railing? : A Lot 6 Click Score: 17    End of Session Equipment Utilized During Treatment: Gait belt Activity Tolerance: Patient tolerated treatment well Patient left: in bed;with call bell/phone within reach;with bed alarm set;with family/visitor present Nurse Communication: Mobility status PT Visit Diagnosis: Other abnormalities of gait and mobility (R26.89);Muscle weakness (generalized) (M62.81)     Time: 4098-1191 PT Time Calculation (min) (ACUTE ONLY): 26 min  Charges:    $Gait Training: 8-22 mins $Therapeutic Activity: 8-22 mins PT General Charges $$ ACUTE PT VISIT: 1 Visit                     Mauro Kaufmann PT Acute Rehabilitation Services Pager [phone] Office [phone]    Chrisopher Pustejovsky 07/11/2023, 2:15 PM

## 2023-07-11 NOTE — Progress Notes (Signed)
   Subjective: 3 Days Post-Op Procedure(s) (LRB): TOTAL HIP ARTHROPLASTY ANTERIOR APPROACH (Right) Patient reports pain as mild.   Patient seen in rounds for Dr. Charlann Boxer. Patient is resting in bed on exam this morning. She was alert, but confused this morning. She did tell me that the back part of her hip has been sore today, but said she did not feel like she needed medication for it. She was not oriented to place or circumstance in regard to having had surgery. Her husband was not with her this morning. Ambulated 100+ feet with PT yesterday.  We will continue therapy today.   Objective: Vital signs in last 24 hours: Temp:  [98 F (36.7 C)] 98 F (36.7 C) (12/13 0430) Pulse Rate:  [60-73] 60 (12/13 0430) Resp:  [15-18] 16 (12/13 0430) BP: (102-139)/(61-65) 139/61 (12/13 0430) SpO2:  [98 %-99 %] 99 % (12/13 0430)  Intake/Output from previous day:  Intake/Output Summary (Last 24 hours) at 07/11/2023 0743 Last data filed at 07/11/2023 0650 Gross per 24 hour  Intake 780 ml  Output 0 ml  Net 780 ml     Intake/Output this shift: No intake/output data recorded.  Labs: Recent Labs    07/09/23 0344 07/10/23 0748  HGB 10.1* 10.0*   Recent Labs    07/09/23 0344 07/10/23 0748  WBC 14.8* 12.9*  RBC 3.32* 3.37*  HCT 30.2* 30.8*  PLT 315 325   Recent Labs    07/08/23 1140 07/09/23 0344  NA 135 130*  K 3.8 4.0  CL 101 102  CO2 22 18*  BUN 15 24*  CREATININE 0.86 1.29*  GLUCOSE 169* 260*  CALCIUM 9.3 8.3*   No results for input(s): "LABPT", "INR" in the last 72 hours.  Exam: General - Patient is Alert and Confused Extremity - Neurologically intact Sensation intact distally Intact pulses distally Dorsiflexion/Plantar flexion intact Dressing - dressing C/D/I Motor Function - intact, moving foot and toes well on exam.   Past Medical History:  Diagnosis Date   Arthritis    Bell's palsy    2007 and 2019 brought on by stress   Breast cancer (HCC)    Depression     Diabetes mellitus without complication (HCC)    Glaucoma    IBS (irritable bowel syndrome)    Memory loss    short term   Vitamin D deficiency     Assessment/Plan: 3 Days Post-Op Procedure(s) (LRB): TOTAL HIP ARTHROPLASTY ANTERIOR APPROACH (Right) Principal Problem:   S/P total right hip arthroplasty  Estimated body mass index is 23.05 kg/m as calculated from the following:   Height as of this encounter: 5\' 2"  (1.575 m).   Weight as of this encounter: 57.2 kg. Advance diet Up with therapy D/C IV fluids  DVT Prophylaxis - Aspirin Weight bearing as tolerated.  Per RN, there was concern about need for HHPT I will order this, but do feel there will be limited benefit for her as she has severe short term memory loss so she will likely not be able to retain any education given and was able to move around her home independently prior to surgery (even tolerating a broken hip for several weeks).  Patient should discharge home today. She will be more comfortable at home as she is very disoriented here.   Rosalene Billings, PA-C Orthopedic Surgery [phone] 07/11/2023, 7:43 AM

## 2023-07-11 NOTE — Progress Notes (Signed)
Alerted this morning by ortho PA, Rosalene Billings, that order placed for HHPT.   Pt/ spouse aware and agreeable and no agency preference - order placed with [facility].    Sherri Zern, LCSW

## 2023-07-11 NOTE — Progress Notes (Signed)
Hypoglycemic Event  CBG: 64  Treatment: 4 oz juice/soda  Symptoms: Shaky  Follow-up CBG: Time:0816 CBG Result:129  Possible Reasons for Event: Inadequate meal intake  Comments/MD notified: Rosalene Billings, PA-C    Bella Kennedy E Mickey Hebel

## 2023-07-11 NOTE — Care Management Important Message (Signed)
Important Message  Patient Details IM Letter given. Name: Sherri Garza MRN: [id number] Date of Birth: Dec 29, 1940   Important Message Given:  Yes - Medicare IM     Caren Macadam 07/11/2023, 10:11 AM

## 2023-07-14 DIAGNOSIS — Z9181 History of falling: Secondary | ICD-10-CM | POA: Diagnosis not present

## 2023-07-14 DIAGNOSIS — Z791 Long term (current) use of non-steroidal anti-inflammatories (NSAID): Secondary | ICD-10-CM | POA: Diagnosis not present

## 2023-07-14 DIAGNOSIS — Z853 Personal history of malignant neoplasm of breast: Secondary | ICD-10-CM | POA: Diagnosis not present

## 2023-07-14 DIAGNOSIS — F32A Depression, unspecified: Secondary | ICD-10-CM | POA: Diagnosis not present

## 2023-07-14 DIAGNOSIS — Z7982 Long term (current) use of aspirin: Secondary | ICD-10-CM | POA: Diagnosis not present

## 2023-07-14 DIAGNOSIS — H409 Unspecified glaucoma: Secondary | ICD-10-CM | POA: Diagnosis not present

## 2023-07-14 DIAGNOSIS — E119 Type 2 diabetes mellitus without complications: Secondary | ICD-10-CM | POA: Diagnosis not present

## 2023-07-14 DIAGNOSIS — E559 Vitamin D deficiency, unspecified: Secondary | ICD-10-CM | POA: Diagnosis not present

## 2023-07-14 DIAGNOSIS — K589 Irritable bowel syndrome without diarrhea: Secondary | ICD-10-CM | POA: Diagnosis not present

## 2023-07-14 DIAGNOSIS — S72001D Fracture of unspecified part of neck of right femur, subsequent encounter for closed fracture with routine healing: Secondary | ICD-10-CM | POA: Diagnosis not present

## 2023-07-14 DIAGNOSIS — G510 Bell's palsy: Secondary | ICD-10-CM | POA: Diagnosis not present

## 2023-07-14 DIAGNOSIS — Z96641 Presence of right artificial hip joint: Secondary | ICD-10-CM | POA: Diagnosis not present

## 2023-07-14 DIAGNOSIS — Z7984 Long term (current) use of oral hypoglycemic drugs: Secondary | ICD-10-CM | POA: Diagnosis not present

## 2023-07-15 NOTE — Discharge Summary (Signed)
Patient ID: Sherri Garza MRN: [id number] DOB/AGE: 05-Oct-1940 82 y.o.  Admit date: 07/08/2023 Discharge date: 07/11/2023  Admission Diagnoses:  Right hip osteoarthritis  Discharge Diagnoses:  Principal Problem:   S/P total right hip arthroplasty   Past Medical History:  Diagnosis Date   Arthritis    Bell's palsy    2007 and 2019 brought on by stress   Breast cancer (HCC)    Depression    Diabetes mellitus without complication (HCC)    Glaucoma    IBS (irritable bowel syndrome)    Memory loss    short term   Vitamin D deficiency     Surgeries: Procedure(s): TOTAL HIP ARTHROPLASTY ANTERIOR APPROACH on 07/08/2023   Consultants:   Discharged Condition: Improved  Hospital Course: ARAYIAH Garza is an 82 y.o. female who was admitted 07/08/2023 for operative treatment ofS/P total right hip arthroplasty. Patient has severe unremitting pain that affects sleep, daily activities, and work/hobbies. After pre-op clearance the patient was taken to the operating room on 07/08/2023 and underwent  Procedure(s): TOTAL HIP ARTHROPLASTY ANTERIOR APPROACH.    Patient was given perioperative antibiotics:  Anti-infectives (From admission, onward)    Start     Dose/Rate Route Frequency Ordered Stop   07/08/23 1930  ceFAZolin (ANCEF) IVPB 2g/100 mL premix        2 g 200 mL/hr over 30 Minutes Intravenous Every 6 hours 07/08/23 1615 07/09/23 0158   07/08/23 1215  ceFAZolin (ANCEF) IVPB 2g/100 mL premix        2 g 200 mL/hr over 30 Minutes Intravenous On call to O.R. 07/08/23 1213 07/08/23 1325   07/08/23 1200  ceFAZolin (ANCEF) IVPB 2g/100 mL premix  Status:  Discontinued        2 g 200 mL/hr over 30 Minutes Intravenous  Once 07/08/23 1157 07/08/23 1213   07/08/23 1200  ceFAZolin (ANCEF) 2-4 GM/100ML-% IVPB       Note to Pharmacy: Vevelyn Royals D: cabinet override      07/08/23 1200 07/08/23 1333        Patient was given sequential compression devices, early ambulation, and  chemoprophylaxis to prevent DVT. Patient worked with PT and was meeting their goals regarding safe ambulation and transfers. She made slow progress in relation to underlying cognitive/memory impairment.  Patient benefited maximally from hospital stay and there were no complications.    Recent vital signs: No data found.   Recent laboratory studies: No results for input(s): "WBC", "HGB", "HCT", "PLT", "NA", "K", "CL", "CO2", "BUN", "CREATININE", "GLUCOSE", "INR", "CALCIUM" in the last 72 hours.  Invalid input(s): "PT", "2"   Discharge Medications:   Allergies as of 07/11/2023       Reactions   Prevagen [apoaequorin] Anaphylaxis   Bacitracin    Ciprofloxacin    Doxycycline    Januvia [sitagliptin]    Metformin And Related Diarrhea   Penicillins    Tetanus Toxoids Rash        Medication List     STOP taking these medications    memantine 10 MG tablet Commonly known as: NAMENDA       TAKE these medications    acetaminophen 650 MG CR tablet Commonly known as: TYLENOL Take 650 mg by mouth 2 (two) times daily.   aspirin 81 MG chewable tablet Chew 1 tablet (81 mg total) by mouth 2 (two) times daily for 28 days.   donepezil 10 MG disintegrating tablet Commonly known as: ARICEPT ODT Take 1 tablet (10 mg total) by mouth at  bedtime.   glipiZIDE 10 MG 24 hr tablet Commonly known as: GLUCOTROL XL Take 10 mg by mouth daily.   Janumet XR 50-1000 MG Tb24 Generic drug: SitaGLIPtin-MetFORMIN HCl Take 1 tablet by mouth 2 (two) times daily.   lactase 3000 units tablet Commonly known as: LACTAID Take 3,000 Units by mouth 2 (two) times daily.   meloxicam 15 MG tablet Commonly known as: MOBIC Take 15 mg by mouth daily.   methocarbamol 500 MG tablet Commonly known as: ROBAXIN Take 1 tablet (500 mg total) by mouth every 8 (eight) hours as needed for muscle spasms.   multivitamin tablet Take 1 tablet by mouth daily.   polyethylene glycol 17 g packet Commonly known as:  MIRALAX / GLYCOLAX Take 17 g by mouth 2 (two) times daily.   QUEtiapine 25 MG tablet Commonly known as: SEROQUEL Take 25 mg by mouth at bedtime as needed (Sleep). What changed: Another medication with the same name was removed. Continue taking this medication, and follow the directions you see here.   senna 8.6 MG Tabs tablet Commonly known as: SENOKOT Take 2 tablets (17.2 mg total) by mouth at bedtime for 14 days.               Discharge Care Instructions  (From admission, onward)           Start     Ordered   07/11/23 0000  Change dressing       Comments: Maintain surgical dressing until follow up in the clinic. If the edges start to pull up, may reinforce with tape. If the dressing is no longer working, may remove and cover with gauze and tape, but must keep the area dry and clean.  Call with any questions or concerns.   07/11/23 0748            Diagnostic Studies: DG Pelvis Portable Result Date: 07/08/2023 CLINICAL DATA:  Status post total right hip arthroplasty. EXAM: PORTABLE PELVIS 1-2 VIEWS COMPARISON:  None Available. FINDINGS: Single frontal view of the bilateral hips. There is diffuse decreased bone mineralization. Status post recent total right hip arthroplasty. No perihardware lucency is seen to indicate hardware failure or loosening. Expected postoperative subcutaneous air lateral to the right hip. Mild bilateral inferior visualized sacroiliac subchondral sclerosis. Minimal superolateral left acetabular degenerative osteophytosis. The pubic symphysis joint space is maintained. No acute fracture or dislocation. Mild-to-moderate atherosclerotic calcifications. IMPRESSION: Status post recent total right hip arthroplasty without evidence of hardware failure. Electronically Signed   By: Neita Garnet M.D.   On: 07/08/2023 16:43   DG HIP PORT UNILAT WITH PELVIS 1V RIGHT Result Date: 07/08/2023 CLINICAL DATA:  Total right hip arthroplasty. Elective surgery.  Intraoperative fluoroscopy. EXAM: DG HIP (WITH OR WITHOUT PELVIS) 1V PORT RIGHT COMPARISON:  None Available. FINDINGS: Images were performed intraoperatively without the presence of a radiologist. The patient is undergoing total right hip arthroplasty. No hardware complication is seen. Total fluoroscopy images: 2 Total fluoroscopy time: 8 seconds Total dose: Radiation Exposure Index (as provided by the fluoroscopic device): 0.83 mGy air Kerma Please see intraoperative findings for further detail. IMPRESSION: Intraoperative fluoroscopy for total right hip arthroplasty. Electronically Signed   By: Neita Garnet M.D.   On: 07/08/2023 16:42   DG C-Arm 1-60 Min-No Report Result Date: 07/08/2023 Fluoroscopy was utilized by the requesting physician.  No radiographic interpretation.   DG C-Arm 1-60 Min-No Report Result Date: 07/08/2023 Fluoroscopy was utilized by the requesting physician.  No radiographic interpretation.    Disposition:  Discharge disposition: 01-Home or Self Care       Discharge Instructions     Call MD / Call 911   Complete by: As directed    If you experience chest pain or shortness of breath, CALL 911 and be transported to the hospital emergency room.  If you develope a fever above 101 F, pus (white drainage) or increased drainage or redness at the wound, or calf pain, call your surgeon's office.   Change dressing   Complete by: As directed    Maintain surgical dressing until follow up in the clinic. If the edges start to pull up, may reinforce with tape. If the dressing is no longer working, may remove and cover with gauze and tape, but must keep the area dry and clean.  Call with any questions or concerns.   Constipation Prevention   Complete by: As directed    Drink plenty of fluids.  Prune juice may be helpful.  You may use a stool softener, such as Colace (over the counter) 100 mg twice a day.  Use MiraLax (over the counter) for constipation as needed.   Diet - low  sodium heart healthy   Complete by: As directed    Increase activity slowly as tolerated   Complete by: As directed    Weight bearing as tolerated with assist device (walker, cane, etc) as directed, use it as long as suggested by your surgeon or therapist, typically at least 4-6 weeks.   Post-operative opioid taper instructions:   Complete by: As directed    POST-OPERATIVE OPIOID TAPER INSTRUCTIONS: It is important to wean off of your opioid medication as soon as possible. If you do not need pain medication after your surgery it is ok to stop day one. Opioids include: Codeine, Hydrocodone(Norco, Vicodin), Oxycodone(Percocet, oxycontin) and hydromorphone amongst others.  Long term and even short term use of opiods can cause: Increased pain response Dependence Constipation Depression Respiratory depression And more.  Withdrawal symptoms can include Flu like symptoms Nausea, vomiting And more Techniques to manage these symptoms Hydrate well Eat regular healthy meals Stay active Use relaxation techniques(deep breathing, meditating, yoga) Do Not substitute Alcohol to help with tapering If you have been on opioids for less than two weeks and do not have pain than it is ok to stop all together.  Plan to wean off of opioids This plan should start within one week post op of your joint replacement. Maintain the same interval or time between taking each dose and first decrease the dose.  Cut the total daily intake of opioids by one tablet each day Next start to increase the time between doses. The last dose that should be eliminated is the evening dose.      TED hose   Complete by: As directed    Use stockings (TED hose) for 2 weeks on both leg(s).  You may remove them at night for sleeping.        Follow-up Information     Durene Romans, MD. Schedule an appointment as soon as possible for a visit in 2 week(s).   Specialty: Orthopedic Surgery Contact information: [address] [place] [address] [phone]         [facility] [address] Follow up.   Specialty: Home Health Services Why: go provide home physical therapy visits Contact information: [address] [place] [address] [phone]  Signed: Cassandria Anger 07/15/2023, 7:17 AM

## 2023-07-17 DIAGNOSIS — S72001D Fracture of unspecified part of neck of right femur, subsequent encounter for closed fracture with routine healing: Secondary | ICD-10-CM | POA: Diagnosis not present

## 2023-07-17 DIAGNOSIS — G510 Bell's palsy: Secondary | ICD-10-CM | POA: Diagnosis not present

## 2023-07-17 DIAGNOSIS — Z96641 Presence of right artificial hip joint: Secondary | ICD-10-CM | POA: Diagnosis not present

## 2023-07-17 DIAGNOSIS — H409 Unspecified glaucoma: Secondary | ICD-10-CM | POA: Diagnosis not present

## 2023-07-17 DIAGNOSIS — F32A Depression, unspecified: Secondary | ICD-10-CM | POA: Diagnosis not present

## 2023-07-17 DIAGNOSIS — E119 Type 2 diabetes mellitus without complications: Secondary | ICD-10-CM | POA: Diagnosis not present

## 2023-07-24 ENCOUNTER — Ambulatory Visit: Payer: Medicare Other | Admitting: Family Medicine

## 2023-07-24 DIAGNOSIS — Z96641 Presence of right artificial hip joint: Secondary | ICD-10-CM | POA: Diagnosis not present

## 2023-07-24 DIAGNOSIS — Z471 Aftercare following joint replacement surgery: Secondary | ICD-10-CM | POA: Diagnosis not present

## 2023-07-31 DIAGNOSIS — G510 Bell's palsy: Secondary | ICD-10-CM | POA: Diagnosis not present

## 2023-07-31 DIAGNOSIS — Z96641 Presence of right artificial hip joint: Secondary | ICD-10-CM | POA: Diagnosis not present

## 2023-07-31 DIAGNOSIS — S72001D Fracture of unspecified part of neck of right femur, subsequent encounter for closed fracture with routine healing: Secondary | ICD-10-CM | POA: Diagnosis not present

## 2023-07-31 DIAGNOSIS — F32A Depression, unspecified: Secondary | ICD-10-CM | POA: Diagnosis not present

## 2023-07-31 DIAGNOSIS — H409 Unspecified glaucoma: Secondary | ICD-10-CM | POA: Diagnosis not present

## 2023-07-31 DIAGNOSIS — E119 Type 2 diabetes mellitus without complications: Secondary | ICD-10-CM | POA: Diagnosis not present

## 2023-08-05 DIAGNOSIS — G510 Bell's palsy: Secondary | ICD-10-CM | POA: Diagnosis not present

## 2023-08-05 DIAGNOSIS — S72001D Fracture of unspecified part of neck of right femur, subsequent encounter for closed fracture with routine healing: Secondary | ICD-10-CM | POA: Diagnosis not present

## 2023-08-05 DIAGNOSIS — F32A Depression, unspecified: Secondary | ICD-10-CM | POA: Diagnosis not present

## 2023-08-05 DIAGNOSIS — Z96641 Presence of right artificial hip joint: Secondary | ICD-10-CM | POA: Diagnosis not present

## 2023-08-05 DIAGNOSIS — H409 Unspecified glaucoma: Secondary | ICD-10-CM | POA: Diagnosis not present

## 2023-08-05 DIAGNOSIS — E119 Type 2 diabetes mellitus without complications: Secondary | ICD-10-CM | POA: Diagnosis not present

## 2023-08-07 DIAGNOSIS — Z96641 Presence of right artificial hip joint: Secondary | ICD-10-CM | POA: Diagnosis not present

## 2023-08-07 DIAGNOSIS — F32A Depression, unspecified: Secondary | ICD-10-CM | POA: Diagnosis not present

## 2023-08-07 DIAGNOSIS — E119 Type 2 diabetes mellitus without complications: Secondary | ICD-10-CM | POA: Diagnosis not present

## 2023-08-07 DIAGNOSIS — S72001D Fracture of unspecified part of neck of right femur, subsequent encounter for closed fracture with routine healing: Secondary | ICD-10-CM | POA: Diagnosis not present

## 2023-08-07 DIAGNOSIS — H409 Unspecified glaucoma: Secondary | ICD-10-CM | POA: Diagnosis not present

## 2023-08-07 DIAGNOSIS — G510 Bell's palsy: Secondary | ICD-10-CM | POA: Diagnosis not present

## 2023-08-11 ENCOUNTER — Encounter: Payer: Self-pay | Admitting: Family Medicine

## 2023-08-11 DIAGNOSIS — R413 Other amnesia: Secondary | ICD-10-CM

## 2023-08-12 NOTE — Telephone Encounter (Signed)
 no auth required sent to [facility] [phone]

## 2023-08-22 DIAGNOSIS — M545 Low back pain, unspecified: Secondary | ICD-10-CM | POA: Diagnosis not present

## 2023-08-22 DIAGNOSIS — S22080A Wedge compression fracture of T11-T12 vertebra, initial encounter for closed fracture: Secondary | ICD-10-CM | POA: Diagnosis not present

## 2023-08-28 DIAGNOSIS — Z5189 Encounter for other specified aftercare: Secondary | ICD-10-CM | POA: Diagnosis not present

## 2023-09-05 ENCOUNTER — Encounter: Payer: Self-pay | Admitting: Internal Medicine

## 2023-09-05 ENCOUNTER — Other Ambulatory Visit: Payer: Self-pay | Admitting: Internal Medicine

## 2023-09-05 DIAGNOSIS — M25561 Pain in right knee: Secondary | ICD-10-CM | POA: Diagnosis not present

## 2023-09-05 DIAGNOSIS — E1165 Type 2 diabetes mellitus with hyperglycemia: Secondary | ICD-10-CM | POA: Diagnosis not present

## 2023-09-05 DIAGNOSIS — D72820 Lymphocytosis (symptomatic): Secondary | ICD-10-CM | POA: Diagnosis not present

## 2023-09-05 DIAGNOSIS — E785 Hyperlipidemia, unspecified: Secondary | ICD-10-CM | POA: Diagnosis not present

## 2023-09-05 DIAGNOSIS — I10 Essential (primary) hypertension: Secondary | ICD-10-CM | POA: Diagnosis not present

## 2023-09-05 DIAGNOSIS — G8929 Other chronic pain: Secondary | ICD-10-CM | POA: Diagnosis not present

## 2023-09-05 DIAGNOSIS — D72829 Elevated white blood cell count, unspecified: Secondary | ICD-10-CM | POA: Diagnosis not present

## 2023-09-05 DIAGNOSIS — E559 Vitamin D deficiency, unspecified: Secondary | ICD-10-CM | POA: Diagnosis not present

## 2023-09-05 DIAGNOSIS — R413 Other amnesia: Secondary | ICD-10-CM | POA: Diagnosis not present

## 2023-09-05 DIAGNOSIS — S22080D Wedge compression fracture of T11-T12 vertebra, subsequent encounter for fracture with routine healing: Secondary | ICD-10-CM | POA: Diagnosis not present

## 2023-09-05 DIAGNOSIS — F22 Delusional disorders: Secondary | ICD-10-CM | POA: Diagnosis not present

## 2023-09-05 DIAGNOSIS — R519 Headache, unspecified: Secondary | ICD-10-CM | POA: Diagnosis not present

## 2023-09-05 DIAGNOSIS — R5381 Other malaise: Secondary | ICD-10-CM

## 2023-09-09 ENCOUNTER — Other Ambulatory Visit: Payer: Self-pay | Admitting: Internal Medicine

## 2023-09-09 DIAGNOSIS — Z13820 Encounter for screening for osteoporosis: Secondary | ICD-10-CM

## 2023-09-11 ENCOUNTER — Other Ambulatory Visit: Payer: Self-pay

## 2023-09-11 MED ORDER — DONEPEZIL HCL 10 MG PO TBDP
10.0000 mg | ORAL_TABLET | Freq: Every day | ORAL | 2 refills | Status: DC
Start: 2023-09-11 — End: 2023-11-04

## 2023-09-12 DIAGNOSIS — H401131 Primary open-angle glaucoma, bilateral, mild stage: Secondary | ICD-10-CM | POA: Diagnosis not present

## 2023-09-17 NOTE — Patient Instructions (Incomplete)
Below is our plan:  We will continue Aricept (donepezil) 5mg  daily for now. We can consider stopping this medication if you feel it is worsening tremor. We could consider changing/adding Namenda (memantine). I have included information in this packet.   Please make sure you are staying well hydrated. I recommend 50-60 ounces daily. Well balanced diet and regular exercise encouraged. Consistent sleep schedule with 6-8 hours recommended.   Please continue follow up with care team as directed.   Follow up with me in 6 months   You may receive a survey regarding today's visit. I encourage you to leave honest feed back as I do use this information to improve patient care. Thank you for seeing me today!   Management of Memory Problems   There are some general things you can do to help manage your memory problems.  Your memory may not in fact recover, but by using techniques and strategies you will be able to manage your memory difficulties better.   1)  Establish a routine. Try to establish and then stick to a regular routine.  By doing this, you will get used to what to expect and you will reduce the need to rely on your memory.  Also, try to do things at the same time of day, such as taking your medication or checking your calendar first thing in the morning. Think about think that you can do as a part of a regular routine and make a list.  Then enter them into a daily planner to remind you.  This will help you establish a routine.   2)  Organize your environment. Organize your environment so that it is uncluttered.  Decrease visual stimulation.  Place everyday items such as keys or cell phone in the same place every day (ie.  Basket next to front door) Use post it notes with a brief message to yourself (ie. Turn off light, lock the door) Use labels to indicate where things go (ie. Which cupboards are for food, dishes, etc.) Keep a notepad and pen by the telephone to take messages   3)  Memory  Aids A diary or journal/notebook/daily planner Making a list (shopping list, chore list, to do list that needs to be done) Using an alarm as a reminder (kitchen timer or cell phone alarm) Using cell phone to store information (Notes, Calendar, Reminders) Calendar/White board placed in a prominent position Post-it notes   In order for memory aids to be useful, you need to have good habits.  It's no good remembering to make a note in your journal if you don't remember to look in it.  Try setting aside a certain time of day to look in journal.   4)  Improving mood and managing fatigue. There may be other factors that contribute to memory difficulties.  Factors, such as anxiety, depression and tiredness can affect memory. Regular gentle exercise can help improve your mood and give you more energy. Exercise: there are short videos created by the [employer] on Health specially for older adults: https://bit.ly/2I30q97.  Mediterranean diet: which emphasizes fruits, vegetables, whole grains, legumes, fish, and other seafood; unsaturated fats such as olive oils; and low amounts of red meat, eggs, and sweets. A variation of this, called MIND (Mediterranean-DASH Intervention for Neurodegenerative Delay) incorporates the DASH (Dietary Approaches to Stop Hypertension) diet, which has been shown to lower high blood pressure, a risk factor for Alzheimer's disease. More information at: ExitMarketing.de.  Aerobic exercise that improve heart health is also  good for the mind.  [employer] on Aging have short videos for exercises that you can do at home: BlindWorkshop.com.pt Simple relaxation techniques may help relieve symptoms of anxiety Try to get back to completing activities or hobbies you enjoyed doing in the past. Learn to pace yourself through activities to decrease fatigue. Find out about some local support groups  where you can share experiences with others. Try and achieve 7-8 hours of sleep at night.   Resources for Family/Caregiver  Online caregiver support groups can be found at WesternTunes.it or call Alzheimer's Association's 24/7 hotline: (603)715-0386. [facility] offers in-person, virtual support groups and individual counseling for both care partners and persons with memory loss. Call for more information at [phone].   Advanced care plan: there are two types of Power of Attorney: healthcare and durable. Healthcare POA is a designated person to make healthcare decisions on your behalf if you were too sick to make them yourself. This person can be selected and documented by your physician. Durable POA has to be set up with a lawyer who takes charge of your finances and estate if you were too sick or cognitively impaired to manage your finances accurately. You can find a local Elder Therapist, art here: NewportRanch.at.  Check out www.planyourlifespan.org, which will help you plan before a crisis and decide who will take care of life considerations in a circumstance where you may not be able to speak for yourself.   Helpful books (available on [employer] or your local bookstore):  By Dr. Carl Best: Keeping Love Alive as Memories Fade: The 5 Love Languages and the Alzheimer's Journey Apr 29, 2015 The Dementia Care Partner's Workbook: A Guide for Understanding, Education, and [employer] - December 27, 2017.  Both available for less than $15.   "Coping with behavior change in dementia: a family caregiver's guide" by Ricardo Jericho & Valora Piccolo "A Caregiver's Guide to Dementia: Using Activities and Other Strategies to Prevent, Reduce and Manage Behavioral Symptoms" by Mahala Menghini Gitlin and [employer].  Youth worker of Joy for the Person with Alzheimer's or Dementia" 4th edition by Tama High  Caregiver videos on common behaviors related to dementia:  PopulationGame.pl  [place] Caregiver Portal: free to sign up, links to local resources: https://[place]-caregivers.com/login    Tasks to improve attention/working memory 1. Good sleep hygiene (7-8 hrs of sleep) 2. Learning a new skill (Painting, Carpentry, Pottery, new language, Knitting). 3.Cognitive exercises (keep a daily journal, Puzzles) 4. Physical exercise and training  (30 min/day X 4 days week) 5. Being on Antidepressant if needed 6.Yoga, Meditation, Tai Chi 7. Decrease alcohol intake 8.Have a clear schedule and structure in daily routine   MIND Diet: The Mediterranean-DASH Diet Intervention for Neurodegenerative Delay, or MIND diet, targets the health of the aging brain. Research participants with the highest MIND diet scores had a significantly slower rate of cognitive decline compared with those with the lowest scores. The effects of the MIND diet on cognition showed greater effects than either the Mediterranean or the DASH diet alone.   The healthy items the MIND diet guidelines suggest include:   3+ servings a day of whole grains 1+ servings a day of vegetables (other than green leafy) 6+ servings a week of green leafy vegetables 5+ servings a week of nuts 4+ meals a week of beans 2+ servings a week of berries 2+ meals a week of poultry 1+ meals a week of fish Mainly olive oil if added fat is used  The unhealthy items, which are higher in saturated and trans fat, include: Less than 5 servings a week of pastries and sweets Less than 4 servings a week of red meat (including beef, pork, lamb, and products made from these meats) Less than one serving a week of cheese and fried foods Less than 1 tablespoon a day of butter/stick margarine

## 2023-09-17 NOTE — Progress Notes (Deleted)
 No chief complaint on file.   HISTORY OF PRESENT ILLNESS:  09/17/23 ALL:  Sherri Garza returns for follow up for memory loss. She was last seen 12/2022. MOCA 18/30. Donepezil continued and memantine added. Since,   01/15/2023 ALL:  Sherri Garza is a 83 y.o. female here today for follow up for memory loss. She was seen in consult with Dr Sherri Garza 06/2022. MRI recommended, however, patient refused. She was started on donepezil and quetiapine continued. Mr Sherri Garza called 01/13/23 inquiring about need for appt. He reported she refused to continue quetiapine but has continued donepezil.   Since, she reports doing well. Mr Sherri Garza presents with her and aides in history. He reports she seems to be having more difficulty with short term memory loss. She gets agitated, easily. He has noted that her right hand shakes sometimes when she is trying to eat or if she is upset. She does not have children or any family close by. No significant relationships with neighbors or community members. She does walk around her neighborhood daily. No falls. She sleeps well. Usually 10-11p-8-9a. She is eating normally. She cooks and performs ADLs. She does not drive. Mr Sherri Garza manages meds.    HISTORY (copied from Dr Sherri Garza previous note)  The patient presents for evaluation of memory loss which has been present over the past 4 years. Husband states this started after a dental X-ray. For 2 years after this she would wake up in the middle of the night screaming that the dentist was trying to kill her. She would be inconsolable for 1-2 hours each night. This did improve over time but her memory continued to decline, especially over the past year. She has started forgetting names and dates. She cannot remember her cats' names.  She has to write multiple notes to remember things and is misplacing objects all over the house. Forgets conversations the day after she has them. She does continue to get more confused at night, so she was  prescribed aripiprazole by her PCP. This helped with the confusion but she continued to wander around the house at night. She was then prescribed Seroquel, which has been more effective and helped her sleep through the night. She is in denial about her memory loss, stating she chooses to forget things because she does not want to remember bad memories.   She has also developed daily severe right-sided headaches over the past couple of years. She was prescribed indomethacin for headaches, but this was discontinued as it was reportedly affecting her blood counts.    TBI:  No past history of TBI Stroke:  no past history of stroke Seizures:  no past history of seizures Sleep: She sleeps well at bedtime. She does have increased confusion in the evening, which has improved with Seroquel Mood: She has become socially isolated since the pandemic. Husband notes she has been more irritable and will say angry things, then will act as if they never fought.   Functional status: Patient lives with her husband Cooking: cooks without issues Shopping: husband does the shopping Driving: She doesn't drive Bills: Husband manages finances,  Medications: Husband helps manage her medications because her blood sugar was getting out of control Ever left the stove on by accident?: no Forget how to use items around the house?: no Getting lost going to familiar places?: no Forgetting loved ones names?: yes Word finding difficulty? none   OTHER MEDICAL CONDITIONS: DM2, glaucoma, vitamin D deficiency   REVIEW OF SYSTEMS: Out of a  complete 14 system review of symptoms, the patient complains only of the following symptoms, agitation and all other reviewed systems are negative.   ALLERGIES: Allergies  Allergen Reactions   Prevagen [Apoaequorin] Anaphylaxis   Bacitracin    Ciprofloxacin    Doxycycline    Januvia [Sitagliptin]    Metformin And Related Diarrhea   Penicillins    Tetanus Toxoids Rash     HOME  MEDICATIONS: Outpatient Medications Prior to Visit  Medication Sig Dispense Refill   acetaminophen (TYLENOL) 650 MG CR tablet Take 650 mg by mouth 2 (two) times daily.     donepezil (ARICEPT ODT) 10 MG disintegrating tablet Take 1 tablet (10 mg total) by mouth at bedtime. 30 tablet 2   glipiZIDE (GLUCOTROL XL) 10 MG 24 hr tablet Take 10 mg by mouth daily.     lactase (LACTAID) 3000 units tablet Take 3,000 Units by mouth 2 (two) times daily.     meloxicam (MOBIC) 15 MG tablet Take 15 mg by mouth daily. (Patient not taking: Reported on 07/07/2023)     methocarbamol (ROBAXIN) 500 MG tablet Take 1 tablet (500 mg total) by mouth every 8 (eight) hours as needed for muscle spasms. 40 tablet 0   Multiple Vitamin (MULTIVITAMIN) tablet Take 1 tablet by mouth daily.     polyethylene glycol (MIRALAX / GLYCOLAX) 17 g packet Take 17 g by mouth 2 (two) times daily. 14 each 0   QUEtiapine (SEROQUEL) 25 MG tablet Take 25 mg by mouth at bedtime as needed (Sleep).     SitaGLIPtin-MetFORMIN HCl (JANUMET XR) 50-1000 MG TB24 Take 1 tablet by mouth 2 (two) times daily.     No facility-administered medications prior to visit.     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   Procedure: TOTAL HIP ARTHROPLASTY ANTERIOR APPROACH;  Surgeon: Durene Romans, MD;  Location: [facility];  Service: Orthopedics;  Laterality: Right;     FAMILY HISTORY: Family History  Problem Relation Age of Onset   Cancer Mother    Cancer Father     Prostate cancer Father    Cancer Brother    Lung cancer Brother      SOCIAL HISTORY: Social History   Socioeconomic History   Marital status: Married    Spouse name: Not on file   Number of children: Not on file   Years of education: Not on file   Highest education level: Not on file  Occupational History   Not on file  Tobacco Use   Smoking status: Never   Smokeless tobacco: Never  Vaping Use   Vaping status: Never Used  Substance and Sexual Activity   Alcohol use: Not Currently   Drug use: Never   Sexual activity: Not Currently  Other Topics Concern   Not on file  Social History Narrative   ** Merged History Encounter **  Social Drivers of Corporate investment banker Strain: Not on file  Food Insecurity: No Food Insecurity (07/08/2023)   Hunger Vital Sign    Worried About Running Out of Food in the Last Year: Never true    Ran Out of Food in the Last Year: Never true  Transportation Needs: No Transportation Needs (07/08/2023)   PRAPARE - Administrator, Civil Service (Medical): No    Lack of Transportation (Non-Medical): No  Physical Activity: Not on file  Stress: Not on file  Social Connections: Unknown (05/02/2023)   Received from [facility]   Social Network    Social Network: Not on file  Intimate Partner Violence: Not At Risk (07/08/2023)   Humiliation, Afraid, Rape, and Kick questionnaire    Fear of Current or Ex-Partner: No    Emotionally Abused: No    Physically Abused: No    Sexually Abused: No     PHYSICAL EXAM  There were no vitals filed for this visit.  There is no height or weight on file to calculate BMI.  Generalized: Well developed, in no acute distress  Cardiology: normal rate and rhythm, no murmur auscultated  Respiratory: clear to auscultation bilaterally    Neurological examination  Mentation: Alert oriented to time, place, and some history taking. Follows all commands speech and language fluent. Easily  agitated and repetitive in questioning.  Cranial nerve II-XII: Pupils were equal round reactive to light. Extraocular movements were full, visual field were full on confrontational test. Facial sensation and strength were normal.  Head turning and shoulder shrug  were normal and symmetric. Motor: The motor testing reveals 5 over 5 strength of all 4 extremities. Good symmetric motor tone is noted throughout.   Gait and station: Gait is normal.    DIAGNOSTIC DATA (LABS, IMAGING, TESTING) - I reviewed patient records, labs, notes, testing and imaging myself where available.  Lab Results  Component Value Date   WBC 12.9 (H) 07/10/2023   HGB 10.0 (L) 07/10/2023   HCT 30.8 (L) 07/10/2023   MCV 91.4 07/10/2023   PLT 325 07/10/2023      Component Value Date/Time   NA 130 (L) 07/09/2023 0344   K 4.0 07/09/2023 0344   CL 102 07/09/2023 0344   CO2 18 (L) 07/09/2023 0344   GLUCOSE 260 (H) 07/09/2023 0344   BUN 24 (H) 07/09/2023 0344   CREATININE 1.29 (H) 07/09/2023 0344   CALCIUM 8.3 (L) 07/09/2023 0344   PROT 7.1 03/18/2023 1454   ALBUMIN 4.0 03/18/2023 1454   AST 28 03/18/2023 1454   ALT 23 03/18/2023 1454   ALKPHOS 67 03/18/2023 1454   BILITOT 0.5 03/18/2023 1454   GFRNONAA 42 (L) 07/09/2023 0344   No results found for: "CHOL", "HDL", "LDLCALC", "LDLDIRECT", "TRIG", "CHOLHDL" Lab Results  Component Value Date   HGBA1C 6.5 (H) 07/07/2023   No results found for: "VITAMINB12" No results found for: "TSH"      No data to display              01/15/2023    1:49 PM 07/03/2022    1:50 PM  Montreal Cognitive Assessment   Visuospatial/ Executive (0/5) 4 0  Naming (0/3) 2 3  Attention: Read list of digits (0/2) 2 1  Attention: Read list of letters (0/1) 1 1  Attention: Serial 7 subtraction starting at 100 (0/3) 1 0  Language: Repeat phrase (0/2) 1 0  Language : Fluency (0/1) 0 0  Abstraction (0/2) 2 0  Delayed Recall (0/5) 0 0  Orientation (0/6) 5 0  Total 18 5      ASSESSMENT AND PLAN  83 y.o. year old female  has a past medical history of Arthritis, Bell's palsy, Breast cancer (HCC), Depression, Diabetes mellitus without complication (HCC), Glaucoma, IBS (irritable bowel syndrome), Memory loss, and Vitamin D deficiency. here with    No diagnosis found.  Marianne Sofia reports doing well. Mr Rasnick feels that short term memory has worsened. MOCA 18/30. She becomes very agitated with testing. We have discussed pros and cons of MRI imaging. Mrs Angst is not interested in imaging at this time. Could consider CT if she is willing. We will continue donepezil 10mg  daily. I will add memantine 5mg  BID. Mr Snooks will start 5mg  daily for 1 week then increase dose to 5mg  BID if she is doing well. Possible side effects reviewed. Healthy lifestyle habits encouraged. She will follow up with PCP as directed. She will return to see me in 6 months, sooner if needed. She verbalizes understanding and agreement with this plan.   No orders of the defined types were placed in this encounter.    No orders of the defined types were placed in this encounter.   I spent 30 minutes of face-to-face and non-face-to-face time with patient.  This included previsit chart review, lab review, study review, order entry, electronic health record documentation, patient education.   Shawnie Dapper, MSN, FNP-C 09/17/2023, 4:33 PM  [facility] [address] [place], [place] [address] [phone]

## 2023-09-22 ENCOUNTER — Ambulatory Visit: Payer: Medicare Other | Admitting: Family Medicine

## 2023-09-22 DIAGNOSIS — R413 Other amnesia: Secondary | ICD-10-CM

## 2023-10-02 ENCOUNTER — Other Ambulatory Visit: Payer: Self-pay

## 2023-10-02 ENCOUNTER — Inpatient Hospital Stay
Admission: EM | Admit: 2023-10-02 | Discharge: 2023-10-06 | DRG: 522 | Disposition: A | Discharge status: Home Health | Attending: Internal Medicine | Admitting: Internal Medicine

## 2023-10-02 ENCOUNTER — Emergency Department

## 2023-10-02 ENCOUNTER — Inpatient Hospital Stay

## 2023-10-02 DIAGNOSIS — W010XXA Fall on same level from slipping, tripping and stumbling without subsequent striking against object, initial encounter: Secondary | ICD-10-CM | POA: Diagnosis present

## 2023-10-02 DIAGNOSIS — R413 Other amnesia: Secondary | ICD-10-CM | POA: Diagnosis not present

## 2023-10-02 DIAGNOSIS — W19XXXA Unspecified fall, initial encounter: Secondary | ICD-10-CM | POA: Diagnosis not present

## 2023-10-02 DIAGNOSIS — Z809 Family history of malignant neoplasm, unspecified: Secondary | ICD-10-CM

## 2023-10-02 DIAGNOSIS — E119 Type 2 diabetes mellitus without complications: Secondary | ICD-10-CM | POA: Diagnosis not present

## 2023-10-02 DIAGNOSIS — Z880 Allergy status to penicillin: Secondary | ICD-10-CM

## 2023-10-02 DIAGNOSIS — D62 Acute posthemorrhagic anemia: Secondary | ICD-10-CM | POA: Diagnosis not present

## 2023-10-02 DIAGNOSIS — Z96641 Presence of right artificial hip joint: Secondary | ICD-10-CM | POA: Diagnosis present

## 2023-10-02 DIAGNOSIS — D72829 Elevated white blood cell count, unspecified: Secondary | ICD-10-CM | POA: Diagnosis present

## 2023-10-02 DIAGNOSIS — S72012A Unspecified intracapsular fracture of left femur, initial encounter for closed fracture: Secondary | ICD-10-CM | POA: Diagnosis not present

## 2023-10-02 DIAGNOSIS — Z9011 Acquired absence of right breast and nipple: Secondary | ICD-10-CM

## 2023-10-02 DIAGNOSIS — Z888 Allergy status to other drugs, medicaments and biological substances status: Secondary | ICD-10-CM

## 2023-10-02 DIAGNOSIS — Z881 Allergy status to other antibiotic agents status: Secondary | ICD-10-CM | POA: Diagnosis not present

## 2023-10-02 DIAGNOSIS — I10 Essential (primary) hypertension: Secondary | ICD-10-CM | POA: Diagnosis present

## 2023-10-02 DIAGNOSIS — Z801 Family history of malignant neoplasm of trachea, bronchus and lung: Secondary | ICD-10-CM

## 2023-10-02 DIAGNOSIS — G9389 Other specified disorders of brain: Secondary | ICD-10-CM | POA: Diagnosis not present

## 2023-10-02 DIAGNOSIS — I6523 Occlusion and stenosis of bilateral carotid arteries: Secondary | ICD-10-CM | POA: Diagnosis not present

## 2023-10-02 DIAGNOSIS — Y92009 Unspecified place in unspecified non-institutional (private) residence as the place of occurrence of the external cause: Secondary | ICD-10-CM

## 2023-10-02 DIAGNOSIS — Z96643 Presence of artificial hip joint, bilateral: Secondary | ICD-10-CM | POA: Diagnosis not present

## 2023-10-02 DIAGNOSIS — S8992XA Unspecified injury of left lower leg, initial encounter: Secondary | ICD-10-CM | POA: Diagnosis not present

## 2023-10-02 DIAGNOSIS — H409 Unspecified glaucoma: Secondary | ICD-10-CM | POA: Diagnosis not present

## 2023-10-02 DIAGNOSIS — F0390 Unspecified dementia without behavioral disturbance: Secondary | ICD-10-CM | POA: Diagnosis present

## 2023-10-02 DIAGNOSIS — F32A Depression, unspecified: Secondary | ICD-10-CM | POA: Diagnosis present

## 2023-10-02 DIAGNOSIS — Z887 Allergy status to serum and vaccine status: Secondary | ICD-10-CM

## 2023-10-02 DIAGNOSIS — N179 Acute kidney failure, unspecified: Secondary | ICD-10-CM | POA: Diagnosis not present

## 2023-10-02 DIAGNOSIS — Z7984 Long term (current) use of oral hypoglycemic drugs: Secondary | ICD-10-CM

## 2023-10-02 DIAGNOSIS — Z96642 Presence of left artificial hip joint: Secondary | ICD-10-CM | POA: Diagnosis not present

## 2023-10-02 DIAGNOSIS — Z853 Personal history of malignant neoplasm of breast: Secondary | ICD-10-CM | POA: Diagnosis not present

## 2023-10-02 DIAGNOSIS — Y92001 Dining room of unspecified non-institutional (private) residence as the place of occurrence of the external cause: Secondary | ICD-10-CM

## 2023-10-02 DIAGNOSIS — Z471 Aftercare following joint replacement surgery: Secondary | ICD-10-CM | POA: Diagnosis not present

## 2023-10-02 DIAGNOSIS — S72002A Fracture of unspecified part of neck of left femur, initial encounter for closed fracture: Secondary | ICD-10-CM | POA: Diagnosis not present

## 2023-10-02 DIAGNOSIS — Z79899 Other long term (current) drug therapy: Secondary | ICD-10-CM | POA: Diagnosis not present

## 2023-10-02 DIAGNOSIS — R000 Tachycardia, unspecified: Secondary | ICD-10-CM | POA: Diagnosis not present

## 2023-10-02 DIAGNOSIS — Z01818 Encounter for other preprocedural examination: Secondary | ICD-10-CM | POA: Diagnosis not present

## 2023-10-02 DIAGNOSIS — S79919A Unspecified injury of unspecified hip, initial encounter: Secondary | ICD-10-CM | POA: Diagnosis not present

## 2023-10-02 HISTORY — DX: Essential (primary) hypertension: I10

## 2023-10-02 LAB — COMPREHENSIVE METABOLIC PANEL
ALT: 22 U/L (ref 0–44)
AST: 32 U/L (ref 15–41)
Albumin: 4 g/dL (ref 3.5–5.0)
Alkaline Phosphatase: 62 U/L (ref 38–126)
Anion gap: 13 (ref 5–15)
BUN: 22 mg/dL (ref 8–23)
CO2: 24 mmol/L (ref 22–32)
Calcium: 9.2 mg/dL (ref 8.9–10.3)
Chloride: 100 mmol/L (ref 98–111)
Creatinine, Ser: 1.07 mg/dL — ABNORMAL HIGH (ref 0.44–1.00)
GFR, Estimated: 52 mL/min — ABNORMAL LOW (ref >60)
Glucose, Bld: 248 mg/dL — ABNORMAL HIGH (ref 70–99)
Potassium: 4.1 mmol/L (ref 3.5–5.1)
Sodium: 137 mmol/L (ref 135–145)
Total Bilirubin: 0.7 mg/dL (ref 0.0–1.2)
Total Protein: 7.3 g/dL (ref 6.5–8.1)

## 2023-10-02 LAB — CBC
HCT: 36.7 % (ref 36.0–46.0)
Hemoglobin: 12.1 g/dL (ref 12.0–15.0)
MCH: 29.4 pg (ref 26.0–34.0)
MCHC: 33 g/dL (ref 30.0–36.0)
MCV: 89.1 fL (ref 80.0–100.0)
Platelets: 380 10*3/uL (ref 150–400)
RBC: 4.12 MIL/uL (ref 3.87–5.11)
RDW: 13.2 % (ref 11.5–15.5)
WBC: 13.4 10*3/uL — ABNORMAL HIGH (ref 4.0–10.5)
nRBC: 0 % (ref 0.0–0.2)

## 2023-10-02 LAB — PROTIME-INR
INR: 1 (ref 0.8–1.2)
Prothrombin Time: 13.4 s (ref 11.4–15.2)

## 2023-10-02 LAB — GLUCOSE, CAPILLARY: Glucose-Capillary: 100 mg/dL — ABNORMAL HIGH (ref 70–99)

## 2023-10-02 LAB — TYPE AND SCREEN
ABO/RH(D): O POS
Antibody Screen: NEGATIVE

## 2023-10-02 LAB — APTT: aPTT: 26 s (ref 24–36)

## 2023-10-02 MED ORDER — QUETIAPINE FUMARATE 25 MG PO TABS
25.0000 mg | ORAL_TABLET | Freq: Every evening | ORAL | Status: DC | PRN
  Administered 2023-10-03 – 2023-10-06 (×2): 25 mg via ORAL
  Filled 2023-10-02 (×3): qty 1

## 2023-10-02 MED ORDER — MORPHINE SULFATE (PF) 2 MG/ML IV SOLN
2.0000 mg | INTRAVENOUS | Status: DC | PRN
  Administered 2023-10-02: 2 mg via INTRAVENOUS
  Filled 2023-10-02: qty 1

## 2023-10-02 MED ORDER — HYDROMORPHONE HCL 1 MG/ML IJ SOLN
0.5000 mg | Freq: Once | INTRAMUSCULAR | Status: AC
Start: 2023-10-02 — End: 2023-10-02
  Administered 2023-10-02: 0.5 mg via INTRAVENOUS
  Filled 2023-10-02: qty 0.5

## 2023-10-02 MED ORDER — LORAZEPAM 2 MG/ML IJ SOLN
0.5000 mg | Freq: Once | INTRAMUSCULAR | Status: AC
  Administered 2023-10-02: 0.5 mg via INTRAVENOUS
  Filled 2023-10-02: qty 1

## 2023-10-02 MED ORDER — ADULT MULTIVITAMIN W/MINERALS CH
1.0000 | ORAL_TABLET | Freq: Every day | ORAL | Status: DC
  Administered 2023-10-04 – 2023-10-06 (×3): 1 via ORAL
  Filled 2023-10-02 (×3): qty 1

## 2023-10-02 MED ORDER — ACETAMINOPHEN 325 MG PO TABS: 650.0000 mg | ORAL_TABLET | Freq: Four times a day (QID) | ORAL | Status: DC | PRN

## 2023-10-02 MED ORDER — METHOCARBAMOL 500 MG PO TABS
500.0000 mg | ORAL_TABLET | Freq: Three times a day (TID) | ORAL | Status: DC | PRN
  Administered 2023-10-05: 500 mg via ORAL
  Filled 2023-10-02: qty 1

## 2023-10-02 MED ORDER — MORPHINE SULFATE (PF) 2 MG/ML IV SOLN
2.0000 mg | Freq: Once | INTRAVENOUS | Status: AC
  Administered 2023-10-02: 2 mg via INTRAVENOUS
  Filled 2023-10-02: qty 1

## 2023-10-02 MED ORDER — SENNOSIDES-DOCUSATE SODIUM 8.6-50 MG PO TABS: 1.0000 | ORAL_TABLET | Freq: Every evening | ORAL | Status: DC | PRN

## 2023-10-02 MED ORDER — ONDANSETRON HCL 4 MG/2ML IJ SOLN: 4.0000 mg | Freq: Three times a day (TID) | INTRAMUSCULAR | Status: DC | PRN

## 2023-10-02 MED ORDER — ONE-DAILY MULTI VITAMINS PO TABS: 1.0000 | ORAL_TABLET | Freq: Every day | ORAL | Status: DC

## 2023-10-02 MED ORDER — INSULIN ASPART 100 UNIT/ML IJ SOLN
0.0000 [IU] | Freq: Every day | INTRAMUSCULAR | Status: DC
  Administered 2023-10-03 – 2023-10-05 (×2): 2 [IU] via SUBCUTANEOUS
  Filled 2023-10-02: qty 1

## 2023-10-02 MED ORDER — DONEPEZIL HCL 5 MG PO TABS
10.0000 mg | ORAL_TABLET | Freq: Every day | ORAL | Status: DC
  Administered 2023-10-02 – 2023-10-05 (×4): 10 mg via ORAL
  Filled 2023-10-02 (×4): qty 2

## 2023-10-02 MED ORDER — SODIUM CHLORIDE 0.9 % IV BOLUS
500.0000 mL | Freq: Once | INTRAVENOUS | Status: AC
  Administered 2023-10-03: 500 mL via INTRAVENOUS

## 2023-10-02 MED ORDER — HYDROMORPHONE HCL 1 MG/ML IJ SOLN
0.5000 mg | Freq: Once | INTRAMUSCULAR | Status: AC
  Administered 2023-10-02: 0.5 mg via INTRAVENOUS
  Filled 2023-10-02: qty 0.5

## 2023-10-02 MED ORDER — OXYCODONE-ACETAMINOPHEN 5-325 MG PO TABS: 1.0000 | ORAL_TABLET | ORAL | Status: DC | PRN

## 2023-10-02 MED ORDER — LIDOCAINE 5 % EX PTCH
1.0000 | MEDICATED_PATCH | CUTANEOUS | Status: DC
  Administered 2023-10-02: 1 via TRANSDERMAL
  Filled 2023-10-02: qty 1

## 2023-10-02 MED ORDER — DONEPEZIL HCL 10 MG PO TBDP: 10.0000 mg | ORAL_TABLET | Freq: Every day | ORAL | Status: DC

## 2023-10-02 MED ORDER — INSULIN ASPART 100 UNIT/ML IJ SOLN
0.0000 [IU] | Freq: Three times a day (TID) | INTRAMUSCULAR | Status: DC
  Administered 2023-10-03 – 2023-10-04 (×2): 2 [IU] via SUBCUTANEOUS
  Administered 2023-10-04 – 2023-10-05 (×3): 3 [IU] via SUBCUTANEOUS
  Administered 2023-10-05 (×2): 2 [IU] via SUBCUTANEOUS
  Administered 2023-10-06: 9 [IU] via SUBCUTANEOUS
  Filled 2023-10-02 (×2): qty 1

## 2023-10-02 MED ORDER — HYDRALAZINE HCL 20 MG/ML IJ SOLN: 5.0000 mg | INTRAMUSCULAR | Status: DC | PRN

## 2023-10-02 NOTE — ED Provider Notes (Signed)
 [facility] Provider Note    Event Date/Time   First MD Initiated Contact with Patient 10/02/23 1755     (approximate)   History   Fall   HPI  Sherri Garza is a 83 y.o. female who presents to the emergency department today via EMS because of concerns for left hip pain after a fall.  Patient does have dementia so cannot give a great history.  She states that she thinks she tripped and that is what caused her to fall onto her left side.  She denies hitting her head or any loss of consciousness.  Is complaining of significant pain to her left hip.  States she has had surgery on her right hip in the past.  She denies any recent illness.    Physical Exam   Triage Vital Signs: ED Triage Vitals [10/02/23 1753]  Encounter Vitals Group     BP      Systolic BP Percentile      Diastolic BP Percentile      Pulse      Resp      Temp      Temp src      SpO2      Weight 135 lb (61.2 kg)     Height      Head Circumference      Peak Flow      Pain Score 6     Pain Loc      Pain Education      Exclude from Growth Chart     Most recent vital signs: There were no vitals filed for this visit.  General: Awake, alert, not completely oriented. CV:  Good peripheral perfusion. Regular rate and rhythm. Resp:  Normal effort. Lungs clear. Abd:  No distention.  Other:  Left hip with tenderness to manipulation. Left leg shortened and externally rotated.   ED Results / Procedures / Treatments   Labs (all labs ordered are listed, but only abnormal results are displayed) Labs Reviewed  CBC - Abnormal; Notable for the following components:      Result Value   WBC 13.4 (*)    All other components within normal limits  COMPREHENSIVE METABOLIC PANEL - Abnormal; Notable for the following components:   Glucose, Bld 248 (*)    Creatinine, Ser 1.07 (*)    GFR, Estimated 52 (*)    All other components within normal limits  PROTIME-INR  APTT  CBC  BASIC METABOLIC  PANEL  TYPE AND SCREEN     EKG  I, Phineas Semen, attending physician, personally viewed and interpreted this EKG  EKG Time: 1803 Rate: 69 Rhythm: sinus rhythm Axis: normal Intervals: qtc 394 QRS: narrow, q waves v1 ST changes: no st elevation Impression: abnormal ekg   RADIOLOGY I independently interpreted and visualized the left hip. My interpretation: subcap fracture Radiology interpretation: pending at time of admission   PROCEDURES:  Critical Care performed: No   MEDICATIONS ORDERED IN ED: Medications - No data to display   IMPRESSION / MDM / ASSESSMENT AND PLAN / ED COURSE  I reviewed the triage vital signs and the nursing notes.                              Differential diagnosis includes, but is not limited to, hip fracture, contusion, dislocation  Patient's presentation is most consistent with acute presentation with potential threat to life or bodily function.  Patient  presented to the emergency department today because of concerns for left hip pain after a fall.  On exam patient does have tenderness to manipulation of the left hip.  Would be concern for possible fracture.  Will obtain x-ray.  X-ray concerning for hip fracture. Discussed with orthopedic surgeon Dr. Allena Katz. Discussed with Dr. Clyde Lundborg with the hospitalist service who will evaluate for admission.     FINAL CLINICAL IMPRESSION(S) / ED DIAGNOSES   Final diagnoses:  Closed fracture of left hip, initial encounter [facility])     Note:  This document was prepared using Dragon voice recognition software and may include unintentional dictation errors.    Phineas Semen, MD 10/02/23 2059

## 2023-10-02 NOTE — H&P (Signed)
 History and Physical    Sherri Garza:[id number] DOB: [birth year] DOA: 10/02/2023  Referring MD/NP/PA:   PCP: Emilio Aspen, MD   Patient coming from:  The patient is coming from home.     Chief Complaint: fall and left hip pain  HPI: Sherri Garza is a 83 y.o. female with medical history significant of DM, HTN, memory loss, depression, IBS, breast cancer, Bell's palsy, leukocytosis, who presents with fall and left hip pain.  Per patient and her husband at the bedside, she accidentally fell in the afternoon which she was walking in dining room and tripped for steps.  No loss of consciousness.  She injured her left hip, causing pain in left hip, which is constant, severe, sharp, nonradiating, aggravated by movement.  She does not headache or neck pain.  No cough, SOB, chest pain, nausea, vomiting, diarrhea, abdominal pain, symptoms of UTI.  No fever or chills.   Data reviewed independently and ED Course: pt was found to have WBC 13.4, mild AKI with creatinine 1.07, BUN 22 and GFR 52, (recent baseline creatinine 0.86 on 07/08/2023), temperature normal, blood pressure 169/71, heart rate 90, RR 18, oxygen saturation 100% on room air.  CXR negative.  CT of head negative for acute intracranial abnormalities.  X-ray of left hip/pelvis showed subcapital left femoral neck fracture. Patient is admitted to MedSurg bed as inpatient.  Dr. Allena Katz of Ortho is consulted.   EKG: I have personally reviewed.  Sinus rhythm, QTc 394, low voltage, artificial effects.   Review of Systems:   General: no fevers, chills, no body weight gain, has fatigue HEENT: no blurry vision, hearing changes or sore throat Respiratory: no dyspnea, coughing, wheezing CV: no chest pain, no palpitations GI: no nausea, vomiting, abdominal pain, diarrhea, constipation GU: no dysuria, burning on urination, increased urinary frequency, hematuria  Ext: no leg edema Neuro: no unilateral weakness, numbness, or tingling, no  vision change or hearing loss. Has fall Skin: no rash, no skin tear. MSK: No muscle spasm, no deformity, no limitation of range of movement in spin. Has left hip pain Heme: No easy bruising.  Travel history: No recent long distant travel.   Allergy:  Allergies  Allergen Reactions   Prevagen [Apoaequorin] Anaphylaxis   Bacitracin    Ciprofloxacin    Doxycycline    Januvia [Sitagliptin]    Metformin And Related Diarrhea   Penicillins    Tetanus Toxoids Rash    Past Medical History:  Diagnosis Date   Arthritis    Bell's palsy    2007 and 2019 brought on by stress   Breast cancer (HCC)    Depression    Diabetes mellitus without complication (HCC)    Glaucoma    HTN (hypertension)    IBS (irritable bowel syndrome)    Memory loss    short term   Vitamin D deficiency     Past Surgical History:  Procedure Laterality Date   APPENDECTOMY     LAPAROSCOPIC OVARIAN CYSTECTOMY     MASTECTOMY Right 1999   TONSILLECTOMY     TOTAL HIP ARTHROPLASTY Right 07/08/2023   Procedure: TOTAL HIP ARTHROPLASTY ANTERIOR APPROACH;  Surgeon: Durene Romans, MD;  Location: [facility];  Service: Orthopedics;  Laterality: Right;    Social History:  reports that she has never smoked. She has never used smokeless tobacco. She reports that she does not currently use alcohol. She reports that she does not use drugs.  Family History:  Family History  Problem Relation Age  of Onset   Cancer Mother    Cancer Father    Prostate cancer Father    Cancer Brother    Lung cancer Brother      Prior to Admission medications   Medication Sig Start Date End Date Taking? Authorizing Provider  acetaminophen (TYLENOL) 650 MG CR tablet Take 650 mg by mouth 2 (two) times daily.    [provider]  donepezil (ARICEPT ODT) 10 MG disintegrating tablet Take 1 tablet (10 mg total) by mouth at bedtime. 09/11/23   Lomax, Amy, NP  glipiZIDE (GLUCOTROL XL) 10 MG 24 hr tablet Take 10 mg by mouth daily. 01/11/23    [provider]  lactase (LACTAID) 3000 units tablet Take 3,000 Units by mouth 2 (two) times daily.    [provider]  meloxicam (MOBIC) 15 MG tablet Take 15 mg by mouth daily. Patient not taking: Reported on 07/07/2023 04/03/23   [provider]  methocarbamol (ROBAXIN) 500 MG tablet Take 1 tablet (500 mg total) by mouth every 8 (eight) hours as needed for muscle spasms. 07/11/23   Cassandria Anger, PA-C  Multiple Vitamin (MULTIVITAMIN) tablet Take 1 tablet by mouth daily.    [provider]  polyethylene glycol (MIRALAX / GLYCOLAX) 17 g packet Take 17 g by mouth 2 (two) times daily. 07/11/23   Cassandria Anger, PA-C  QUEtiapine (SEROQUEL) 25 MG tablet Take 25 mg by mouth at bedtime as needed (Sleep). 05/30/22   [provider]  SitaGLIPtin-MetFORMIN HCl (JANUMET XR) 50-1000 MG TB24 Take 1 tablet by mouth 2 (two) times daily.    [provider]    Physical Exam: Vitals:   10/02/23 1755 10/02/23 1800 10/02/23 1940 10/02/23 2030  BP: (!) 177/115 130/60 (!) 169/71 (!) 168/82  Pulse: 79 78 90 97  Resp: 17 17 18 18   Temp: 98.5 F (36.9 C)     TempSrc: Oral     SpO2: 100% 100% 100% 99%  Weight:       General: Not in acute distress HEENT:       Eyes: PERRL, EOMI, no jaundice       ENT: No discharge from the ears and nose, no pharynx injection, no tonsillar enlargement.        Neck: No JVD, no bruit, no mass felt. Heme: No neck lymph node enlargement. Cardiac: S1/S2, RRR, No murmurs, No gallops or rubs. Respiratory: No rales, wheezing, rhonchi or rubs. GI: Soft, nondistended, nontender, no rebound pain, no organomegaly, BS present. GU: No hematuria Ext: No pitting leg edema bilaterally. 1+DP/PT pulse bilaterally. Musculoskeletal: has left hip tenderness Skin: No rashes.  Neuro: Alert, oriented X3, cranial nerves II-XII grossly intact, moves all extremities. Psych: Patient is not psychotic, no suicidal or hemocidal ideation.  Labs on  Admission: I have personally reviewed following labs and imaging studies  CBC: Recent Labs  Lab 10/02/23 1755  WBC 13.4*  HGB 12.1  HCT 36.7  MCV 89.1  PLT 380   Basic Metabolic Panel: Recent Labs  Lab 10/02/23 1755  NA 137  K 4.1  CL 100  CO2 24  GLUCOSE 248*  BUN 22  CREATININE 1.07*  CALCIUM 9.2   GFR: Estimated Creatinine Clearance: 35.5 mL/min (A) (by C-G formula based on SCr of 1.07 mg/dL (H)). Liver Function Tests: Recent Labs  Lab 10/02/23 1755  AST 32  ALT 22  ALKPHOS 62  BILITOT 0.7  PROT 7.3  ALBUMIN 4.0   No results for input(s): "LIPASE", "AMYLASE" in the  last 168 hours. No results for input(s): "AMMONIA" in the last 168 hours. Coagulation Profile: Recent Labs  Lab 10/02/23 1755  INR 1.0   Cardiac Enzymes: No results for input(s): "CKTOTAL", "CKMB", "CKMBINDEX", "TROPONINI" in the last 168 hours. BNP (last 3 results) No results for input(s): "PROBNP" in the last 8760 hours. HbA1C: No results for input(s): "HGBA1C" in the last 72 hours. CBG: No results for input(s): "GLUCAP" in the last 168 hours. Lipid Profile: No results for input(s): "CHOL", "HDL", "LDLCALC", "TRIG", "CHOLHDL", "LDLDIRECT" in the last 72 hours. Thyroid Function Tests: No results for input(s): "TSH", "T4TOTAL", "FREET4", "T3FREE", "THYROIDAB" in the last 72 hours. Anemia Panel: No results for input(s): "VITAMINB12", "FOLATE", "FERRITIN", "TIBC", "IRON", "RETICCTPCT" in the last 72 hours. Urine analysis: No results found for: "COLORURINE", "APPEARANCEUR", "LABSPEC", "PHURINE", "GLUCOSEU", "HGBUR", "BILIRUBINUR", "KETONESUR", "PROTEINUR", "UROBILINOGEN", "NITRITE", "LEUKOCYTESUR" Sepsis Labs: @LABRCNTIP (procalcitonin:4,lacticidven:4) )No results found for this or any previous visit (from the past 240 hours).   Radiological Exams on Admission:   Assessment/Plan Principal Problem:   Closed left hip fracture (HCC) Active Problems:   Fall at home, initial encounter    HTN (hypertension)   Diabetes mellitus without complication (HCC)   AKI (acute kidney injury) (HCC)   Leukocytosis   Memory loss   Assessment and Plan:  Closed left hip fracture [place]):  X-ray showed subcapital left femoral neck fracture.  Patient has moderate pain now. No neurovascular compromise. Orthopedic surgeon, Dr. Allena Katz was consulted.   - will admit to Med-surg bed as inpt - Pain control: prn morphine, percocet and tyleno - When necessary Zofran for nausea - Robaxin for muscle spasm - Lidoderm patch for pain - type and cross - INR/PTT  Fall at home, initial encounter -PT/OT when able to (not ordered now) -f/u CT-head  Leukocytosis: WBC  13.4.  No fever, likely due to stress-induced demargination. Patient does not have signs of infection. -Follow-up CBC  HTN (hypertension): Patient's not taking medications currently.  Her blood pressure has been controlled per her husband.  The elevated blood pressure 169/71 is likely due to pain -IV hydralazine as needed  Diabetes mellitus without complication [facility]): Recent A1c 6.5, well-controlled.  Patient is taking glipizide and Janumet at home. -Adding scale insulin  AKI (acute kidney injury) (HCC): Mild, creatinine 1.07, BUN 22, GFR 52 -Hold Mobic -IV fluid: Normal saline 500 cc  Memory loss -Donepezil -As needed Seroquel at bedtime for sleeping    Perioperative Cardiac Risk: pt has multiple comorbidities as listed above. Currently patient is active and largely independent of ADLs and, IADLs. No recent acute cardiac issues.  No hx of CAD or CHF. Patient does not have chest pain, shortness of breath, palpitation, leg edema.  EKG has no acute change. At this time point, no further work up is needed. Patient's GUPTA score perioperative myocardial infarction or cardaic arrest is 1.59 %.  I discussed the risk with patient and her husband, pt would like to proceed for surgery.      DVT ppx: SCD  Code Status: Full code per pt  and her husband  Family Communication:   Yes, patient's husband at bed side.      Disposition Plan:  rehab/SNF  Consults called: Dr. Allena Katz of Ortho  Admission status and Level of care: Med-Surg:   as inpt        Dispo: The patient is from: Home              Anticipated d/c is to: SNF  Anticipated d/c date is: 2 days              Patient currently is not medically stable to d/c.    Severity of Illness:  The appropriate patient status for this patient is INPATIENT. Inpatient status is judged to be reasonable and necessary in order to provide the required intensity of service to ensure the patient's safety. The patient's presenting symptoms, physical exam findings, and initial radiographic and laboratory data in the context of their chronic comorbidities is felt to place them at high risk for further clinical deterioration. Furthermore, it is not anticipated that the patient will be medically stable for discharge from the hospital within 2 midnights of admission.   * I certify that at the point of admission it is my clinical judgment that the patient will require inpatient hospital care spanning beyond 2 midnights from the point of admission due to high intensity of service, high risk for further deterioration and high frequency of surveillance required.*       Date of Service 10/02/2023    Lorretta Harp [facility]   If 7PM-7AM, please contact night-coverage [email or web address] 10/02/2023, 8:52 PM

## 2023-10-02 NOTE — ED Notes (Signed)
 ED Provider at bedside.

## 2023-10-02 NOTE — ED Notes (Signed)
 Pt to radiology.

## 2023-10-02 NOTE — ED Triage Notes (Signed)
 Pt arrives via EMS from home due to a fall. Pt Provided pt of fentanyl due to severe left hip pain due to a fall.

## 2023-10-02 NOTE — Progress Notes (Signed)
 Full consult note and discussion with patient to follow.  Called by ED staff. Imaging reviewed.  - Plan for surgery (L hip hemiarthroplasty) tomorrow, likely late morning  - NPO after midnight - Hold anticoagulation - Admit to Hospitalist team.

## 2023-10-02 NOTE — ED Notes (Signed)
Niu, MD at bedside. °

## 2023-10-02 NOTE — ED Notes (Addendum)
 Pt returns from radiology requesting more pain medication. Derrill Kay, MD notified.  Pillow provided for comfort per pt request. Family at bedside.

## 2023-10-03 ENCOUNTER — Inpatient Hospital Stay

## 2023-10-03 ENCOUNTER — Inpatient Hospital Stay: Admitting: Certified Registered"

## 2023-10-03 ENCOUNTER — Encounter
Admission: EM | Disposition: A | Discharge status: Home Health | Payer: Self-pay | Source: Home / Self Care | Attending: Internal Medicine

## 2023-10-03 ENCOUNTER — Encounter: Payer: Self-pay | Admitting: Family Medicine

## 2023-10-03 ENCOUNTER — Encounter: Payer: Self-pay | Admitting: Internal Medicine

## 2023-10-03 ENCOUNTER — Other Ambulatory Visit: Payer: Self-pay

## 2023-10-03 DIAGNOSIS — S72002A Fracture of unspecified part of neck of left femur, initial encounter for closed fracture: Secondary | ICD-10-CM | POA: Diagnosis not present

## 2023-10-03 HISTORY — PX: HIP ARTHROPLASTY: SHX981

## 2023-10-03 LAB — CBC
HCT: 33.6 % — ABNORMAL LOW (ref 36.0–46.0)
Hemoglobin: 11.1 g/dL — ABNORMAL LOW (ref 12.0–15.0)
MCH: 28.8 pg (ref 26.0–34.0)
MCHC: 33 g/dL (ref 30.0–36.0)
MCV: 87.3 fL (ref 80.0–100.0)
Platelets: 323 10*3/uL (ref 150–400)
RBC: 3.85 MIL/uL — ABNORMAL LOW (ref 3.87–5.11)
RDW: 13 % (ref 11.5–15.5)
WBC: 11.6 10*3/uL — ABNORMAL HIGH (ref 4.0–10.5)
nRBC: 0 % (ref 0.0–0.2)

## 2023-10-03 LAB — GLUCOSE, CAPILLARY
Glucose-Capillary: 175 mg/dL — ABNORMAL HIGH (ref 70–99)
Glucose-Capillary: 165 mg/dL — ABNORMAL HIGH (ref 70–99)
Glucose-Capillary: 161 mg/dL — ABNORMAL HIGH (ref 70–99)
Glucose-Capillary: 159 mg/dL — ABNORMAL HIGH (ref 70–99)
Glucose-Capillary: 246 mg/dL — ABNORMAL HIGH (ref 70–99)

## 2023-10-03 LAB — BASIC METABOLIC PANEL
Anion gap: 9 (ref 5–15)
BUN: 18 mg/dL (ref 8–23)
CO2: 25 mmol/L (ref 22–32)
Calcium: 8.8 mg/dL — ABNORMAL LOW (ref 8.9–10.3)
Chloride: 104 mmol/L (ref 98–111)
Creatinine, Ser: 0.91 mg/dL (ref 0.44–1.00)
GFR, Estimated: >60 mL/min (ref >60)
Glucose, Bld: 194 mg/dL — ABNORMAL HIGH (ref 70–99)
Potassium: 4.2 mmol/L (ref 3.5–5.1)
Sodium: 138 mmol/L (ref 135–145)

## 2023-10-03 SURGERY — HEMIARTHROPLASTY (BIPOLAR) HIP, POSTERIOR APPROACH FOR FRACTURE
Anesthesia: Spinal | Site: Hip | Laterality: Left

## 2023-10-03 MED ORDER — PROPOFOL 500 MG/50ML IV EMUL
INTRAVENOUS | Status: DC | PRN
Start: 2023-10-03 — End: 2023-10-03
  Administered 2023-10-03: 75 ug/kg/min via INTRAVENOUS

## 2023-10-03 MED ORDER — METOCLOPRAMIDE HCL 5 MG/ML IJ SOLN: 5.0000 mg | Freq: Three times a day (TID) | INTRAMUSCULAR | Status: DC | PRN

## 2023-10-03 MED ORDER — BUPIVACAINE HCL (PF) 0.5 % IJ SOLN
INTRAMUSCULAR | Status: DC | PRN
  Administered 2023-10-03: 2.6 mL via INTRATHECAL

## 2023-10-03 MED ORDER — OXYCODONE HCL 5 MG/5ML PO SOLN: 5.0000 mg | Freq: Once | ORAL | Status: DC | PRN

## 2023-10-03 MED ORDER — DOCUSATE SODIUM 100 MG PO CAPS
100.0000 mg | ORAL_CAPSULE | Freq: Two times a day (BID) | ORAL | Status: DC
  Administered 2023-10-04 – 2023-10-06 (×5): 100 mg via ORAL
  Filled 2023-10-03 (×6): qty 1

## 2023-10-03 MED ORDER — BUPIVACAINE HCL (PF) 0.5 % IJ SOLN
INTRAMUSCULAR | Status: AC
  Filled 2023-10-03: qty 30

## 2023-10-03 MED ORDER — SENNOSIDES-DOCUSATE SODIUM 8.6-50 MG PO TABS: 1.0000 | ORAL_TABLET | Freq: Every evening | ORAL | Status: DC | PRN

## 2023-10-03 MED ORDER — SODIUM CHLORIDE 0.9 % IR SOLN
Status: DC | PRN
Start: 2023-10-03 — End: 2023-10-03
  Administered 2023-10-03: 100 mL

## 2023-10-03 MED ORDER — ZOLPIDEM TARTRATE 5 MG PO TABS: 5.0000 mg | ORAL_TABLET | Freq: Every evening | ORAL | Status: DC | PRN

## 2023-10-03 MED ORDER — OXYCODONE HCL 5 MG PO TABS
2.5000 mg | ORAL_TABLET | ORAL | Status: DC | PRN
  Administered 2023-10-03 – 2023-10-05 (×2): 5 mg via ORAL
  Filled 2023-10-03: qty 1

## 2023-10-03 MED ORDER — BUPIVACAINE LIPOSOME 1.3 % IJ SUSP
INTRAMUSCULAR | Status: AC
  Filled 2023-10-03: qty 20

## 2023-10-03 MED ORDER — KETAMINE HCL 50 MG/5ML IJ SOSY
PREFILLED_SYRINGE | INTRAMUSCULAR | Status: AC
  Filled 2023-10-03: qty 5

## 2023-10-03 MED ORDER — LACTATED RINGERS IV SOLN: INTRAVENOUS | Status: DC

## 2023-10-03 MED ORDER — KETOROLAC TROMETHAMINE 15 MG/ML IJ SOLN
7.5000 mg | Freq: Four times a day (QID) | INTRAMUSCULAR | Status: AC
  Administered 2023-10-03 – 2023-10-04 (×3): 7.5 mg via INTRAVENOUS
  Filled 2023-10-03 (×4): qty 1

## 2023-10-03 MED ORDER — TRANEXAMIC ACID-NACL 1000-0.7 MG/100ML-% IV SOLN
INTRAVENOUS | Status: AC
  Filled 2023-10-03: qty 100

## 2023-10-03 MED ORDER — KETAMINE HCL 50 MG/5ML IJ SOSY
PREFILLED_SYRINGE | INTRAMUSCULAR | Status: DC | PRN
  Administered 2023-10-03: 30 mg via INTRAVENOUS

## 2023-10-03 MED ORDER — FENTANYL CITRATE (PF) 100 MCG/2ML IJ SOLN
INTRAMUSCULAR | Status: DC | PRN
Start: 2023-10-03 — End: 2023-10-03
  Administered 2023-10-03 (×2): 50 ug via INTRAVENOUS

## 2023-10-03 MED ORDER — PHENYLEPHRINE HCL-NACL 20-0.9 MG/250ML-% IV SOLN
INTRAVENOUS | Status: DC | PRN
Start: 2023-10-03 — End: 2023-10-03
  Administered 2023-10-03: 20 ug/min via INTRAVENOUS

## 2023-10-03 MED ORDER — ACETAMINOPHEN 10 MG/ML IV SOLN: 1000.0000 mg | Freq: Once | INTRAVENOUS | Status: DC | PRN

## 2023-10-03 MED ORDER — ONDANSETRON HCL 4 MG/2ML IJ SOLN: 4.0000 mg | Freq: Four times a day (QID) | INTRAMUSCULAR | Status: DC | PRN

## 2023-10-03 MED ORDER — FLEET ENEMA RE ENEM: 1.0000 | ENEMA | Freq: Once | RECTAL | Status: DC | PRN

## 2023-10-03 MED ORDER — CEFAZOLIN SODIUM-DEXTROSE 2-4 GM/100ML-% IV SOLN
2.0000 g | Freq: Once | INTRAVENOUS | Status: AC
Start: 2023-10-03 — End: 2023-10-03
  Administered 2023-10-03: 2 g via INTRAVENOUS

## 2023-10-03 MED ORDER — ACETAMINOPHEN 10 MG/ML IV SOLN
INTRAVENOUS | Status: AC
  Filled 2023-10-03: qty 100

## 2023-10-03 MED ORDER — CEFAZOLIN SODIUM-DEXTROSE 2-4 GM/100ML-% IV SOLN
INTRAVENOUS | Status: AC
  Filled 2023-10-03: qty 100

## 2023-10-03 MED ORDER — OXYCODONE HCL 5 MG PO TABS: 5.0000 mg | ORAL_TABLET | Freq: Once | ORAL | Status: DC | PRN

## 2023-10-03 MED ORDER — EPHEDRINE SULFATE-NACL 50-0.9 MG/10ML-% IV SOSY
PREFILLED_SYRINGE | INTRAVENOUS | Status: DC | PRN
  Administered 2023-10-03 (×2): 5 mg via INTRAVENOUS

## 2023-10-03 MED ORDER — FENTANYL CITRATE (PF) 100 MCG/2ML IJ SOLN: 25.0000 ug | INTRAMUSCULAR | Status: DC | PRN

## 2023-10-03 MED ORDER — MENTHOL 3 MG MT LOZG: 1.0000 | LOZENGE | OROMUCOSAL | Status: DC | PRN

## 2023-10-03 MED ORDER — ENOXAPARIN SODIUM 40 MG/0.4ML IJ SOSY
40.0000 mg | PREFILLED_SYRINGE | INTRAMUSCULAR | Status: DC
  Administered 2023-10-04 – 2023-10-06 (×3): 40 mg via SUBCUTANEOUS
  Filled 2023-10-03 (×3): qty 0.4

## 2023-10-03 MED ORDER — ONDANSETRON HCL 4 MG PO TABS: 4.0000 mg | ORAL_TABLET | Freq: Four times a day (QID) | ORAL | Status: DC | PRN

## 2023-10-03 MED ORDER — BISACODYL 10 MG RE SUPP: 10.0000 mg | Freq: Every day | RECTAL | Status: DC | PRN

## 2023-10-03 MED ORDER — METOCLOPRAMIDE HCL 5 MG PO TABS: 5.0000 mg | ORAL_TABLET | Freq: Three times a day (TID) | ORAL | Status: DC | PRN

## 2023-10-03 MED ORDER — TRANEXAMIC ACID-NACL 1000-0.7 MG/100ML-% IV SOLN
1000.0000 mg | INTRAVENOUS | Status: AC
  Administered 2023-10-03: 1000 mg via INTRAVENOUS

## 2023-10-03 MED ORDER — PHENOL 1.4 % MT LIQD: 1.0000 | OROMUCOSAL | Status: DC | PRN

## 2023-10-03 MED ORDER — PHENYLEPHRINE 80 MCG/ML (10ML) SYRINGE FOR IV PUSH (FOR BLOOD PRESSURE SUPPORT)
PREFILLED_SYRINGE | INTRAVENOUS | Status: DC | PRN
  Administered 2023-10-03 (×2): 80 ug via INTRAVENOUS

## 2023-10-03 MED ORDER — HYDROMORPHONE HCL 1 MG/ML IJ SOLN: 0.2000 mg | INTRAMUSCULAR | Status: DC | PRN

## 2023-10-03 MED ORDER — PROPOFOL 10 MG/ML IV BOLUS
INTRAVENOUS | Status: DC | PRN
  Administered 2023-10-03: 30 mg via INTRAVENOUS
  Administered 2023-10-03 (×4): 10 mg via INTRAVENOUS

## 2023-10-03 MED ORDER — ACETAMINOPHEN 500 MG PO TABS
1000.0000 mg | ORAL_TABLET | Freq: Three times a day (TID) | ORAL | Status: DC
  Administered 2023-10-03 – 2023-10-06 (×8): 1000 mg via ORAL
  Filled 2023-10-03 (×9): qty 2

## 2023-10-03 MED ORDER — FENTANYL CITRATE (PF) 100 MCG/2ML IJ SOLN
INTRAMUSCULAR | Status: AC
  Filled 2023-10-03: qty 2

## 2023-10-03 MED ORDER — SENNA 8.6 MG PO TABS
1.0000 | ORAL_TABLET | Freq: Two times a day (BID) | ORAL | Status: DC
Start: 2023-10-03 — End: 2023-10-06
  Administered 2023-10-04 – 2023-10-06 (×5): 8.6 mg via ORAL
  Filled 2023-10-03 (×6): qty 1

## 2023-10-03 MED ORDER — ONDANSETRON HCL 4 MG/2ML IJ SOLN
4.0000 mg | Freq: Once | INTRAMUSCULAR | Status: AC
  Administered 2023-10-03: 4 mg via INTRAVENOUS

## 2023-10-03 MED ORDER — ENSURE ENLIVE PO LIQD
237.0000 mL | Freq: Two times a day (BID) | ORAL | Status: DC
  Administered 2023-10-04 – 2023-10-06 (×5): 237 mL via ORAL

## 2023-10-03 MED ORDER — ACETAMINOPHEN 10 MG/ML IV SOLN
INTRAVENOUS | Status: DC | PRN
  Administered 2023-10-03: 1000 mg via INTRAVENOUS

## 2023-10-03 MED ORDER — CEFAZOLIN SODIUM-DEXTROSE 1-4 GM/50ML-% IV SOLN
1.0000 g | Freq: Four times a day (QID) | INTRAVENOUS | Status: AC
  Administered 2023-10-03 (×2): 1 g via INTRAVENOUS
  Filled 2023-10-03 (×3): qty 50

## 2023-10-03 MED ORDER — ONDANSETRON HCL 4 MG/2ML IJ SOLN
INTRAMUSCULAR | Status: AC
  Filled 2023-10-03: qty 2

## 2023-10-03 MED ORDER — TRANEXAMIC ACID-NACL 1000-0.7 MG/100ML-% IV SOLN
1000.0000 mg | Freq: Once | INTRAVENOUS | Status: AC
  Administered 2023-10-03: 1000 mg via INTRAVENOUS

## 2023-10-03 MED ORDER — MUPIROCIN 2 % EX OINT
1.0000 | TOPICAL_OINTMENT | Freq: Two times a day (BID) | CUTANEOUS | Status: DC
  Administered 2023-10-04 – 2023-10-06 (×4): 1 via NASAL
  Filled 2023-10-03: qty 22

## 2023-10-03 MED ORDER — OXYCODONE HCL 5 MG PO TABS
5.0000 mg | ORAL_TABLET | ORAL | Status: DC | PRN
  Administered 2023-10-03 – 2023-10-04 (×4): 10 mg via ORAL
  Filled 2023-10-03 (×3): qty 2
  Filled 2023-10-03: qty 1
  Filled 2023-10-03 (×2): qty 2

## 2023-10-03 SURGICAL SUPPLY — 56 items
BLADE SAW SGTL 13X75X1.27 (BLADE) ×1 IMPLANT
BNDG COHESIVE 4X5 TAN STRL LF (GAUZE/BANDAGES/DRESSINGS) ×1 IMPLANT
BOWL CEMENT MIXING ADV NOZZLE (MISCELLANEOUS) ×1 IMPLANT
CEMENT BONE 10-PACK (Cement) ×2 IMPLANT
CHLORAPREP W/TINT 26 (MISCELLANEOUS) ×2 IMPLANT
COVER MAYO STAND STRL (DRAPES) ×1 IMPLANT
DERMABOND ADVANCED .7 DNX12 (GAUZE/BANDAGES/DRESSINGS) ×1 IMPLANT
DRAPE INCISE IOBAN 66X60 STRL (DRAPES) ×1 IMPLANT
DRAPE SHEET LG 3/4 BI-LAMINATE (DRAPES) ×1 IMPLANT
DRAPE SURG ORHT 6 SPLT 77X108 (DRAPES) ×1 IMPLANT
DRAPE TABLE BACK 80X90 (DRAPES) ×1 IMPLANT
DRAPE U-SHAPE 47X51 STRL (DRAPES) ×1 IMPLANT
DRSG OPSITE POSTOP 4X10 (GAUZE/BANDAGES/DRESSINGS) IMPLANT
DRSG OPSITE POSTOP 4X8 (GAUZE/BANDAGES/DRESSINGS) IMPLANT
ELECT REM PT RETURN 9FT ADLT (ELECTROSURGICAL) ×1 IMPLANT
ELECTRODE REM PT RTRN 9FT ADLT (ELECTROSURGICAL) ×1 IMPLANT
GAUZE XEROFORM 1X8 LF (GAUZE/BANDAGES/DRESSINGS) ×1 IMPLANT
GLOVE BIOGEL PI IND STRL 8 (GLOVE) ×2 IMPLANT
GLOVE SKINSENSE STRL SZ8.0 LF (GLOVE) ×1 IMPLANT
GLOVE SURG ORTHO 8.0 STRL STRW (GLOVE) ×2 IMPLANT
GOWN STRL REUS W/ TWL LRG LVL3 (GOWN DISPOSABLE) ×1 IMPLANT
GOWN STRL REUS W/ TWL XL LVL3 (GOWN DISPOSABLE) ×1 IMPLANT
HEAD UNPLR 44XMDLR STRL HIP (Orthopedic Implant) IMPLANT
HEMOVAC 400ML (MISCELLANEOUS) IMPLANT
HOOD PEEL AWAY T7 (MISCELLANEOUS) ×2 IMPLANT
IMMBOLIZER KNEE 19 BLUE UNIV (SOFTGOODS) IMPLANT
IV NS IRRIG 3000ML ARTHROMATIC (IV SOLUTION) ×1 IMPLANT
KIT DRAIN HEMOVAC JP 7FR 400ML (MISCELLANEOUS) IMPLANT
KIT PREP HIP W/CEMENT RESTRICT (Miscellaneous) ×1 IMPLANT
KIT TURNOVER KIT A (KITS) ×1 IMPLANT
MANIFOLD NEPTUNE II (INSTRUMENTS) ×2 IMPLANT
NDL HYPO 22X1.5 SAFETY MO (MISCELLANEOUS) ×1 IMPLANT
NDL MAYO CATGUT SZ1 (NEEDLE) ×1 IMPLANT
NDL SAFETY ECLIPSE 18X1.5 (NEEDLE) ×1 IMPLANT
NEEDLE HYPO 22X1.5 SAFETY MO (MISCELLANEOUS) ×1 IMPLANT
NEEDLE MAYO CATGUT SZ1 (NEEDLE) ×1 IMPLANT
NS IRRIG 500ML POUR BTL (IV SOLUTION) ×1 IMPLANT
PACK HIP PROSTHESIS (MISCELLANEOUS) ×1 IMPLANT
PENCIL SMOKE EVACUATOR (MISCELLANEOUS) ×1 IMPLANT
PULSAVAC PLUS IRRIG FAN TIP (DISPOSABLE) ×1 IMPLANT
SLEEVE UNITRAX V40 +4 (Orthopedic Implant) IMPLANT
SLEEVE UNITRAX V40 +8 (Orthopedic Implant) IMPLANT
SPACER OSTEO CEMENT 10HIP (Spacer) IMPLANT
STAPLER SKIN PROX 35W (STAPLE) ×1 IMPLANT
STEM CMT HIP 2 39X124 127D (Stem) IMPLANT
SUT ETHIBOND #5 BRAIDED 30INL (SUTURE) ×1 IMPLANT
SUT VIC AB 0 CT1 36 (SUTURE) ×1 IMPLANT
SUT VIC AB 2-0 CT2 27 (SUTURE) ×2 IMPLANT
SYR 20ML LL LF (SYRINGE) ×2 IMPLANT
TAPE MICROFOAM 4IN (TAPE) ×1 IMPLANT
TIP FAN IRRIG PULSAVAC PLUS (DISPOSABLE) ×1 IMPLANT
TRAP FLUID SMOKE EVACUATOR (MISCELLANEOUS) ×1 IMPLANT
TRAY FOLEY MTR SLVR 16FR STAT (SET/KITS/TRAYS/PACK) IMPLANT
TUBE KAMVAC SUCTION (TUBING) ×1 IMPLANT
TUBE SUCT KAM VAC (TUBING) ×1 IMPLANT
WATER STERILE IRR 1000ML POUR (IV SOLUTION) ×1 IMPLANT

## 2023-10-03 NOTE — H&P (Signed)
 H&P reviewed. No significant changes noted.

## 2023-10-03 NOTE — Progress Notes (Signed)
 Pt A&OX3, prn oxycodone given for pain of 6. BP (!) 149/89 (BP Location: Left Arm)   Pulse 78   Temp 97.7 F (36.5 C) (Oral)   Resp 18   Wt 61.2 kg   SpO2 97%   BMI 24.69 kg/m  No skin issues besides knee immobilizer post surgery pt re-oriented to room. No other needs voiced at this time. Reva Bores 10/03/23 3:55 PM

## 2023-10-03 NOTE — Op Note (Signed)
 DATE OF SURGERY: 10/03/2023  PREOPERATIVE DIAGNOSIS: Left femoral neck fracture  POSTOPERATIVE DIAGNOSIS: Left femoral neck fracture  PROCEDURE: Left hip hemiarthroplasty  SURGEON: Rosealee Albee, MD  ASSISTANTS:   EBL: 100 cc  COMPONENTS:  Stryker - Accolade C Size 2 Stem Stryker - Unitrax 44mm head with +8 offset neck   INDICATIONS: Sherri Garza is a 83 y.o. female who sustained a displaced femoral neck fracture after a fall. Risks and benefits of hip hemiarthroplasty were explained to the patient and/or family. Risks include but are not limited to bleeding, infection, injury to tissues, nerves, vessels, periprosthetic infection, dislocation, limb length discrepancy and risks of anesthesia. The patient and/or family understands these risks, has completed an informed consent and wishes to proceed.   PROCEDURE:  The patient was identified in the preoperative holding area and the operative extremity was marked.  The patient was then transferred to the operating room suite and mobilized from the hospital gurney to the operating room table. Spinal anesthesia was administered without complication. The patient was then transitioned to a lateral position.  All bony prominences were padded per protocol.  An axillary roll was placed.  Careful attention was paid to the contralateral side peroneal nerve, which was free from pressure with use of appropriate padding and blankets. A time-out was performed to confirm the patient's identity and the correct laterality of surgery. The patient was then prepped and draped in the usual sterile fashion. Appropriate pre-operative antibiotics were administered. Tranexamic acid was administered preoperatively.    An incision that centered on the posterior tip of the greater trochanter with a posterior curve was made. Dissection was carried down through the subcutaneous tissue.  Careful attention was made to maintain hemostasis using electrocautery.  Dissection  brought [place] to the level of the deep fascia where the gluteus maximus muscle and proximal portion of the IT band were identified.  The proximal region of the IT band was incised in linear fashion and this incision was extended proximally in a curvilinear fashion to split the gluteus maximus muscle parallel to its fibers to minimize bleeding.  This was accomplished using a combination bovie electrocautery as well as blunt dissection.  The trochanteric bursa was then visualized and dissected from anterior to posterior. A blunt homan retractor was placed beneath the abductors. The piriformis tendon and short external rotators were visualized. Bovie electrocautery was used to cut these with the capsule as one L-shaped flap. This was tagged at the corner with #5 Ethibond. At this point, the femoral neck fracture was visualized. An oscillating saw was used to make a new neck cut approximately 15mm above the lesser tuberosity with the use of a neck cut guide. The head was then freed from its remaining soft tissue attachments and measured. The head trial was then inserted into the acetabulum and sized appropriately.    We then turned our attention to preparing the femoral canal. First, a box cut was performed utilizing the box osteotome. A canal finder was inserted by hand and sequential broaching was then performed. The calcar planer was inserted onto the broach and used to smooth the calcar appropriately.  A trial stem, neutral neck, and head were inserted into the acetabulum and placed through range of motion. Intraoperative radiographs were taken to assess hardware position and leg lengths. The hip was again dislocated and the femoral trial components were removed. An appropriately sized cement restrictor was placed. The canal was irrigated with pulse lavage and dried. A cement gun was  used to place cement into the femoral canal. Cement was then pressurized. The actual stem was inserted into the femoral canal and then  driven onto the calcar. Position was held until the cement hardened. The trial head was then again inserted on the femoral component and found to be appropriate. Trial was removed and the permanent head/neck was Morse tapered onto the femoral stem and then reduced into the acetabulum.    The hip stability and length were reassessed and found to be satisfactory.  The wound was then copiously irrigated with normal saline solution. The tagged sutures of the capsule and piriformis were sewn to the gluteus medius tendon. This adequately closed the hip capsule. The IT band and gluteus maximus fascia were then closed with 0-Vicryl in a running, locked fashion. A mixture of Exparel and bupivicaine was administered.  The subdermal layer was closed with 2-0 Vicryl in a buried interrupted fashion. Skin was approximated with staples.  Sterile dressing was applied. An abduction pillow was placed. The patient was mobilized from the lateral position back to supine on the operating room table and then awakened from general anesthesia without complication.   POSTOPERATIVE PLAN: The patient will be WBAT on operative extremity. Lovenox 40mg /day x 4 weeks to start on POD#1. IV Abx x 24 hours. PT/OT on POD#1. Posterior hip precautions.

## 2023-10-03 NOTE — Progress Notes (Signed)
 Initial Nutrition Assessment  DOCUMENTATION CODES:   Not applicable  INTERVENTION:   -Once diet is advanced, add:   -MVI with minerals daily -Ensure Enlive po BID, each supplement provides 350 kcal and 20 grams of protein.  -Feeding assistance with meals  NUTRITION DIAGNOSIS:   Increased nutrient needs related to post-op healing as evidenced by estimated needs.  GOAL:   Patient will meet greater than or equal to 90% of their needs  MONITOR:   PO intake, Supplement acceptance, Diet advancement  REASON FOR ASSESSMENT:   Consult Assessment of nutrition requirement/status  ASSESSMENT:   Pt with medical history significant of DM, HTN, memory loss, depression, IBS, breast cancer, Bell's palsy, leukocytosis, who presents with fall and left hip pain.  Pt admitted with closed lt hip fracture.   Per orthopedics notes, plan for hemi today. Pt is currently NPO for procedure.   Spoke with pt at bedside, who was pleasant and in good spirits today. RD reoriented pt the place and situation as she did not remember why she was in the hospital or how she got here. Pt reports that she slept well. Pt reports that she is not hungry right now and "does not eat much at home". RD attempted to obtain diet history from pt, however, pt only able to tell this RD that she eat very little and there are periods of time that she does not eat at all. Pt shares that her intake often varies based upon what she has going on that day. Noted pt with history of memory loss, so unsure of accuracy of history given.   Noted pt with tremors to her rt hand. May benefit from feeding assistance.   Reviewed wt hx; pt has experienced a 3.9% wt loss over the past 9 months, which is not significant for time frame. Pt is unsure if she has lost weight.   Discussed importance of good meal and supplement intake to promote healing. Pt with increased nutritional needs for post-operative healing and would benefit from addition  of oral nutrition supplements.   Lab Results  Component Value Date   HGBA1C 6.5 (H) 07/07/2023   PTA DM medications are 10 mg glipizide daily.   Labs reviewed: CBGS: 100-175 (inpatient orders for glycemic control are 0-5 units insulin aspart daily at bedtime and 0-9 units insulin aspart TID with meals).    NUTRITION - FOCUSED PHYSICAL EXAM:  Flowsheet Row Most Recent Value  Orbital Region No depletion  Upper Arm Region No depletion  Thoracic and Lumbar Region No depletion  Buccal Region No depletion  Temple Region No depletion  Clavicle Bone Region No depletion  Clavicle and Acromion Bone Region No depletion  Scapular Bone Region No depletion  Dorsal Hand No depletion  Patellar Region Mild depletion  Anterior Thigh Region Mild depletion  Posterior Calf Region Mild depletion  Edema (RD Assessment) Mild  Hair Reviewed  Eyes Reviewed  Mouth Reviewed  Skin Reviewed  Nails Reviewed       Diet Order:   Diet Order             Diet NPO time specified Except for: Sips with Meds, Ice Chips  Diet effective midnight                   EDUCATION NEEDS:   Education needs have been addressed  Skin:  Skin Assessment: Reviewed RN Assessment  Last BM:  10/02/23  Height:   Ht Readings from Last 1 Encounters:  07/08/23 5\' 2"  (  1.575 m)    Weight:   Wt Readings from Last 1 Encounters:  10/02/23 61.2 kg    Ideal Body Weight:  50 kg  BMI:  Body mass index is 24.69 kg/m.  Estimated Nutritional Needs:   Kcal:  1650-1850  Protein:  85-100 grams  Fluid:  > 1.6 L    Levada Schilling, RD, LDN, CDCES Registered Dietitian III Certified Diabetes Care and Education Specialist If unable to reach this RD, please use "RD Inpatient" group chat on secure chat between hours of 8am-4 pm daily

## 2023-10-03 NOTE — [place] FL2 (Signed)
 [place] MEDICAID FL2 LEVEL OF CARE FORM     IDENTIFICATION  Patient Name: Sherri Garza Birthdate: May 21, 1941 Sex: female Admission Date (Current Location): 10/02/2023  [facility] and [place] Number:  Chiropodist and Address:  [facility], [address] [place], [place] [address]      Provider Number: [id number]  Attending Physician Name and Address:  Charise Killian, MD  Relative Name and Phone Number:  Pollyann Roa  [facility]  Emergency Contact  [phone]  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    Orientation RESPIRATION BLADDER Height & Weight     Self, Time, Situation, Place  Normal Continent Weight: 61.2 kg Height:     BEHAVIORAL SYMPTOMS/MOOD NEUROLOGICAL BOWEL NUTRITION STATUS      Continent Diet  AMBULATORY STATUS COMMUNICATION OF NEEDS Skin   Extensive Assist Verbally Normal, Surgical wounds                       Personal Care Assistance Level of Assistance  Bathing, Feeding, Dressing Bathing Assistance: Limited assistance Feeding assistance: Limited assistance Dressing Assistance: Limited assistance     Functional Limitations Info  Sight, Hearing, Speech Sight Info: Adequate Hearing Info: Adequate Speech Info: Adequate    SPECIAL CARE FACTORS FREQUENCY  PT (By licensed PT), OT (By licensed OT)     PT Frequency: 5 times  per week OT Frequency: 5 times per week            Contractures Contractures Info: Not present    Additional Factors Info  Code Status, Allergies Code Status Info: full code Allergies Info: Prevagen (Apoaequorin), Bacitracin, Ciprofloxacin, Doxycycline, Januvia (Sitagliptin), Metformin And Related, Penicillins, Tetanus Toxoids           Current Medications (10/03/2023):  This is the current hospital active medication list Current Facility-Administered Medications  Medication Dose Route Frequency Provider Last Rate Last Admin   acetaminophen (TYLENOL) tablet 650 mg  650 mg Oral Q6H PRN Lorretta Harp, MD       donepezil (ARICEPT) tablet 10 mg  10 mg Oral QHS Lorretta Harp, MD   10 mg at 10/02/23 2140   [START ON 10/04/2023] feeding supplement (ENSURE ENLIVE / ENSURE PLUS) liquid 237 mL  237 mL Oral BID BM Charise Killian, MD       hydrALAZINE (APRESOLINE) injection 5 mg  5 mg Intravenous Q2H PRN Lorretta Harp, MD       insulin aspart (novoLOG) injection 0-5 Units  0-5 Units Subcutaneous QHS Lorretta Harp, MD       insulin aspart (novoLOG) injection 0-9 Units  0-9 Units Subcutaneous TID WC Lorretta Harp, MD       lidocaine (LIDODERM) 5 % 1 patch  1 patch Transdermal Q24H Lorretta Harp, MD   1 patch at 10/02/23 2048   methocarbamol (ROBAXIN) tablet 500 mg  500 mg Oral  Q8H PRN Lorretta Harp, MD       morphine (PF) 2 MG/ML injection 2 mg  2 mg Intravenous Q4H PRN Lorretta Harp, MD   2 mg at 10/02/23 2048   multivitamin with minerals tablet 1 tablet  1 tablet Oral Daily Rockwell Alexandria, RPH       mupirocin ointment (BACTROBAN) 2 % 1 Application  1 Application Nasal BID Lorretta Harp, MD       ondansetron [facility]) injection 4 mg  4 mg Intravenous Q8H PRN Lorretta Harp, MD       oxyCODONE-acetaminophen (PERCOCET/ROXICET) 5-325 MG per tablet 1 tablet  1 tablet Oral Q4H PRN Lorretta Harp, MD       QUEtiapine (SEROQUEL) tablet 25 mg  25 mg Oral QHS PRN Lorretta Harp, MD       senna-docusate (Senokot-S) tablet 1 tablet  1  tablet Oral QHS PRN Lorretta Harp, MD         Discharge Medications: Please see discharge summary for a list of discharge medications.  Relevant Imaging Results:  Relevant Lab Results:   Additional Information SS# [id number]  Marlowe Sax, RN

## 2023-10-03 NOTE — Final Consult Note (Signed)
 ORTHOPAEDIC CONSULTATION  REQUESTING PHYSICIAN: Charise Killian, MD  Chief Complaint:   L hip pain  History of Present Illness: Sherri Garza is a 83 y.o. female with medical history significant of DM, HTN, memory loss, depression, IBS, breast cancer, and Bell's palsy, who presents with fall and left hip pain.   Per patient and her husband at the bedside, she accidentally fell in the afternoon which she was walking in dining room and tripped. The patient noted immediate hip pain and inability to ambulate.  The patient ambulates unassisted at baseline. Pain is worse with any sort of movement.  X-rays in the emergency department show a left displaced femoral neck fracture.  Of note, she underwent R THA by Dr. Charlann Boxer in [place] on 07/08/23 after she presented as an outpatient with a chronic femoral neck fracture. She had been recovering well from that procedure.   Past Medical History:  Diagnosis Date   Arthritis    Bell's palsy    2007 and 2019 brought on by stress   Breast cancer (HCC)    Depression    Diabetes mellitus without complication (HCC)    Glaucoma    HTN (hypertension)    IBS (irritable bowel syndrome)    Memory loss    short term   Vitamin D deficiency    Past Surgical History:  Procedure Laterality Date   APPENDECTOMY     LAPAROSCOPIC OVARIAN CYSTECTOMY     MASTECTOMY Right 1999   TONSILLECTOMY     TOTAL HIP ARTHROPLASTY Right 07/08/2023   Procedure: TOTAL HIP ARTHROPLASTY ANTERIOR APPROACH;  Surgeon: Durene Romans, MD;  Location: [facility];  Service: Orthopedics;  Laterality: Right;   Social History   Socioeconomic History   Marital status: Married    Spouse name: Not on file   Number of children: Not on file   Years of education: Not on file   Highest education level: Not on file  Occupational History   Not on file  Tobacco Use   Smoking status: Never   Smokeless tobacco: Never  Vaping  Use   Vaping status: Never Used  Substance and Sexual Activity   Alcohol use: Not Currently   Drug use: Never   Sexual activity: Not Currently  Other Topics Concern   Not on file  Social History Narrative   ** Merged History Encounter **       Social Drivers of Health   Financial Resource Strain: Not on file  Food Insecurity: No Food Insecurity (10/02/2023)   Hunger Vital Sign    Worried About Running Out of Food in the Last Year: Never true    Ran Out of Food in the Last Year: Never true  Transportation Needs: No Transportation Needs (10/02/2023)   PRAPARE - Administrator, Civil Service (Medical): No    Lack of Transportation (Non-Medical): No  Physical Activity: Not on file  Stress: Not on file  Social Connections: Socially Integrated (10/02/2023)   Social Connection and Isolation Panel [NHANES]    Frequency of Communication with Friends and Family: Three times a week    Frequency of Social Gatherings with Friends and Family: Three times a week    Attends Religious Services: 1 to 4 times per year    Active Member of Clubs or Organizations: Yes    Attends Banker Meetings: 1 to 4 times per year    Marital Status: Married   Family History  Problem Relation Age of Onset   Cancer  Mother    Cancer Father    Prostate cancer Father    Cancer Brother    Lung cancer Brother    Allergies  Allergen Reactions   Prevagen [Apoaequorin] Anaphylaxis   Bacitracin    Ciprofloxacin    Doxycycline    Januvia [Sitagliptin]    Metformin And Related Diarrhea   Penicillins    Tetanus Toxoids Rash   Prior to Admission medications   Medication Sig Start Date End Date Taking? Authorizing Provider  acetaminophen (TYLENOL) 650 MG CR tablet Take 650 mg by mouth 2 (two) times daily.   Yes [provider]  donepezil (ARICEPT ODT) 10 MG disintegrating tablet Take 1 tablet (10 mg total) by mouth at bedtime. 09/11/23  Yes Lomax, Amy, NP  glipiZIDE (GLUCOTROL XL) 10  MG 24 hr tablet Take 10 mg by mouth daily. 01/11/23  Yes [provider]  methocarbamol (ROBAXIN) 500 MG tablet Take 1 tablet (500 mg total) by mouth every 8 (eight) hours as needed for muscle spasms. 07/11/23  Yes Cassandria Anger, PA-C  Multiple Vitamin (MULTIVITAMIN) tablet Take 1 tablet by mouth daily.   Yes [provider]  QUEtiapine (SEROQUEL) 25 MG tablet Take 25 mg by mouth at bedtime as needed (Sleep). 05/30/22  Yes [provider]  SitaGLIPtin-MetFORMIN HCl (JANUMET XR) 50-1000 MG TB24 Take 1 tablet by mouth 2 (two) times daily.   Yes [provider]  lactase (LACTAID) 3000 units tablet Take 3,000 Units by mouth 2 (two) times daily. Patient not taking: Reported on 10/02/2023    [provider]  meloxicam (MOBIC) 15 MG tablet Take 15 mg by mouth daily. Patient not taking: Reported on 07/07/2023 04/03/23   [provider]  polyethylene glycol (MIRALAX / GLYCOLAX) 17 g packet Take 17 g by mouth 2 (two) times daily. Patient not taking: Reported on 10/02/2023 07/11/23   Cassandria Anger, PA-C   Recent Labs    10/02/23 1755 10/03/23 0429  WBC 13.4* 11.6*  HGB 12.1 11.1*  HCT 36.7 33.6*  PLT 380 323  K 4.1 4.2  CL 100 104  CO2 24 25  BUN 22 18  CREATININE 1.07* 0.91  GLUCOSE 248* 194*  CALCIUM 9.2 8.8*  INR 1.0  --    CT HEAD WO CONTRAST ( ) Result Date: 10/02/2023 CLINICAL DATA:  Status post fall. EXAM: CT HEAD WITHOUT CONTRAST TECHNIQUE: Contiguous axial images were obtained from the base of the skull through the vertex without intravenous contrast. RADIATION DOSE REDUCTION: This exam was performed according to the departmental dose-optimization program which includes automated exposure control, adjustment of the mA and/or kV according to patient size and/or use of iterative reconstruction technique. COMPARISON:  None Available. FINDINGS: Brain: There is generalized cerebral atrophy with widening of the extra-axial spaces and  ventricular dilatation. There are areas of decreased attenuation within the white matter tracts of the supratentorial brain, consistent with microvascular disease changes. Vascular: Marked severity bilateral cavernous carotid artery calcification is noted. Skull: Normal. Negative for fracture or focal lesion. Sinuses/Orbits: No acute finding. Other: None. IMPRESSION: 1. No acute intracranial abnormality. 2. Generalized cerebral atrophy and microvascular disease changes of the supratentorial brain. Electronically Signed   By: Aram Candela M.D.   On: 10/02/2023 22:33   DG Chest Portable 1 View Result Date: 10/02/2023 CLINICAL DATA:  Preoperative evaluation. EXAM: PORTABLE CHEST 1 VIEW COMPARISON:  November 14, 2021 FINDINGS: The heart size and mediastinal contours are within normal limits. There is no evidence of an acute  infiltrate, pleural effusion or pneumothorax. Radiopaque surgical clips are seen along the right axilla. Multilevel degenerative changes are noted throughout the thoracic spine. IMPRESSION: No active cardiopulmonary disease. Electronically Signed   By: Aram Candela M.D.   On: 10/02/2023 22:31   DG Hip Unilat W or Wo Pelvis 2-3 Views Left Result Date: 10/02/2023 CLINICAL DATA:  Recent fall with left hip pain, initial encounter EXAM: DG HIP (WITH OR WITHOUT PELVIS) 3V LEFT COMPARISON:  None Available. FINDINGS: Subcapital left femoral neck fracture is noted with impaction and angulation at the fracture site. Pelvic ring is intact. Prior right hip replacement is noted. IMPRESSION: Subcapital left femoral neck fracture. Electronically Signed   By: Alcide Clever M.D.   On: 10/02/2023 21:38     Positive ROS: All other systems have been reviewed and were otherwise negative with the exception of those mentioned in the HPI and as above.  Physical Exam: BP (!) 158/75   Pulse 80   Temp 98.2 F (36.8 C) (Temporal)   Resp 18   Wt 61.2 kg   SpO2 98%   BMI 24.69 kg/m  General:  Alert, no  acute distress Psychiatric:  Patient is NOT competent for consent. Unable to state year or president.  Orthopedic Exam:  LLE: + DF/PF/EHL SILT grossly over foot Foot wwp +Log roll/axial load Leg shortened and externally rotated   Imaging:  As above: L displaced femoral neck fracture  Assessment/Plan: ARIELY RIDDELL is a 83 y.o. female with a L displaced femoral neck fracture  1. I discussed the various treatment options including both surgical and non-surgical management of the fracture with the patient and and her husband (medical PoA). We discussed the high risk of perioperative complications due to patient's age, dementia, and other co-morbidities. After discussion of risks, benefits, and alternatives to surgery, the family and/or patient were in agreement to proceed with surgery. The goals of surgery would be to provide adequate pain relief and allow for mobilization. Plan for surgery is L hip hemiarthroplasty today, 10/03/2023. 2. NPO until OR 3. Hold anticoagulation in advance of OR   Sherri Garza   10/03/2023 11:32 AM

## 2023-10-03 NOTE — Anesthesia Procedure Notes (Signed)
 Spinal  Patient location during procedure: OR Start time: 10/03/2023 11:47 AM End time: 10/03/2023 11:52 AM Reason for block: surgical anesthesia Staffing Performed: anesthesiologist  Anesthesiologist: Reed Breech, MD Performed by: Reed Breech, MD Authorized by: Reed Breech, MD   Preanesthetic Checklist Completed: patient identified, IV checked, site marked, risks and benefits discussed, surgical consent, monitors and equipment checked, pre-op evaluation and timeout performed Spinal Block Patient position: right lateral decubitus Prep: ChloraPrep Patient monitoring: heart rate, cardiac monitor, continuous pulse ox and blood pressure Approach: left paramedian Location: L2-3 Injection technique: single-shot Needle Needle type: Whitacre  Needle gauge: 22 G Needle length: 9 cm Assessment Sensory level: T4 Events: CSF return Additional Notes Two attempts.  Initial attempt midline with 27 G without CSF return.  Second attempt left paramedian with Whitacre 22 G successful.

## 2023-10-03 NOTE — Anesthesia Preprocedure Evaluation (Addendum)
 Anesthesia Evaluation  Patient identified by MRN, date of birth, ID band Patient awake    Reviewed: Allergy & Precautions, NPO status , Patient's Chart, lab work & pertinent test results  History of Anesthesia Complications Negative for: history of anesthetic complications  Airway Mallampati: IV   Neck ROM: Full    Dental no notable dental hx.    Pulmonary neg pulmonary ROS   Pulmonary exam normal breath sounds clear to auscultation       Cardiovascular hypertension, Normal cardiovascular exam Rhythm:Regular Rate:Normal  ECG 10/02/23:  Sinus rhythm Atrial premature complex Prolonged PR interval Low voltage, extremity and precordial leads Repol abnrm suggests ischemia, diffuse leads   Neuro/Psych  PSYCHIATRIC DISORDERS  Depression    negative neurological ROS     GI/Hepatic negative GI ROS,,,  Endo/Other  diabetes, Type 2    Renal/GU      Musculoskeletal  (+) Arthritis ,    Abdominal   Peds  Hematology negative hematology ROS (+)   Anesthesia Other Findings   Reproductive/Obstetrics                             Anesthesia Physical Anesthesia Plan  ASA: 2  Anesthesia Plan: Spinal   Post-op Pain Management:    Induction: Intravenous  PONV Risk Score and Plan: 3 and Propofol infusion, TIVA, Treatment may vary due to age or medical condition and Ondansetron  Airway Management Planned: Natural Airway and Nasal Cannula  Additional Equipment:   Intra-op Plan:   Post-operative Plan:   Informed Consent: I have reviewed the patients History and Physical, chart, labs and discussed the procedure including the risks, benefits and alternatives for the proposed anesthesia with the patient or authorized representative who has indicated his/her understanding and acceptance.     Consent reviewed with POA and Dental advisory given  Plan Discussed with: CRNA  Anesthesia Plan Comments:  (History and consent obtained from patient's husband at bedside.  Plan for spinal and GA with natural airway, LMA/GETA backup.  Patient's husband consented for risks of anesthesia including but not limited to:  - adverse reactions to medications - damage to eyes, teeth, lips or other oral mucosa - nerve damage due to positioning  - sore throat or hoarseness - headache, bleeding, infection, nerve damage 2/2 spinal - damage to heart, brain, nerves, lungs, other parts of body or loss of life  Informed patient's husband about role of CRNA in peri- and intra-operative care; he voiced understanding.)        Anesthesia Quick Evaluation

## 2023-10-03 NOTE — Anesthesia Procedure Notes (Signed)
 Procedure Name: MAC Date/Time: 10/03/2023 12:14 PM  Performed by: Cheral Bay, CRNAPre-anesthesia Checklist: Patient identified, Emergency Drugs available, Suction available, Patient being monitored and Timeout performed Patient Re-evaluated:Patient Re-evaluated prior to induction Oxygen Delivery Method: Nasal cannula Induction Type: IV induction Placement Confirmation: positive ETCO2 and CO2 detector

## 2023-10-03 NOTE — Transfer of Care (Signed)
 Immediate Anesthesia Transfer of Care Note  Patient: Sherri Garza  Procedure(s) Performed: HEMIARTHROPLASTY (BIPOLAR) HIP, POSTERIOR APPROACH FOR FRACTURE (Left: Hip)  Patient Location: PACU  Anesthesia Type:Spinal  Level of Consciousness: drowsy  Airway & Oxygen Therapy: Patient Spontanous Breathing and Patient connected to nasal cannula oxygen  Post-op Assessment: Report given to RN and Post -op Vital signs reviewed and stable  Post vital signs: Reviewed and stable  Last Vitals:  Vitals Value Taken Time  BP 105/54 10/03/23 1400  Temp    Pulse 64 10/03/23 1405  Resp 18 10/03/23 1405  SpO2 100 % 10/03/23 1405  Vitals shown include unfiled device data.  Last Pain:  Vitals:   10/03/23 1043  TempSrc: Temporal  PainSc:          Complications: No notable events documented.

## 2023-10-03 NOTE — TOC Initial Note (Signed)
 Transition of Care [facility]) - Initial/Assessment Note    Patient Details  Name: Sherri Garza MRN: [id number] Date of Birth: Apr 01, 1941  [facility] CM/SW Contact:    Marlowe Sax, RN Phone Number: 10/03/2023, 10:02 AM  Clinical Narrative:                  The patient lives in ILF at [facility] and will go to their rehab facility, FL[facility] PASSR comepleted  Expected Discharge Plan: Skilled Nursing Facility Barriers to Discharge: Continued Medical Work up   Patient Goals and CMS Choice            Expected Discharge Plan and Services   Discharge Planning Services: CM Consult   Living arrangements for the past 2 months: Independent Living Facility                                      Prior Living Arrangements/Services Living arrangements for the past 2 months: Independent Living Facility Lives with:: Spouse   Do you feel safe going back to the place where you live?: Yes               Activities of Daily Living   ADL Screening (condition at time of admission) Independently performs ADLs?: No Does the patient have a NEW difficulty with bathing/dressing/toileting/self-feeding that is expected to last >3 days?: No Does the patient have a NEW difficulty with getting in/out of bed, walking, or climbing stairs that is expected to last >3 days?: Yes (Initiates electronic notice to provider for possible PT consult) Does the patient have a NEW difficulty with communication that is expected to last >3 days?: No Is the patient deaf or have difficulty hearing?: Yes Does the patient have difficulty seeing, even when wearing glasses/contacts?: No Does the patient have difficulty concentrating, remembering, or making decisions?: Yes  Permission Sought/Granted                  Emotional Assessment              Admission diagnosis:  Closed left hip fracture (HCC) [S72.002A] Closed fracture of left hip, initial encounter (HCC)  [S72.002A] Patient Active Problem List   Diagnosis Date Noted   Closed left hip fracture (HCC) 10/02/2023   Fall at home, initial encounter 10/02/2023   AKI (acute kidney injury) (HCC) 10/02/2023   Leukocytosis 10/02/2023   Diabetes mellitus without complication (HCC)    Memory loss    HTN (hypertension)    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 [place] [address] Phone: [phone] Fax: [phone]     Social Drivers of Health (SDOH) Social History: SDOH Screenings   Food Insecurity: No Food Insecurity (10/02/2023)  Housing: Low Risk  (10/02/2023)  Transportation Needs: No Transportation Needs (10/02/2023)  Utilities: Not At Risk (10/02/2023)  Alcohol Screen: Low Risk  (03/18/2023)  Depression (PHQ2-9): Low Risk  (03/18/2023)  Social Connections: Socially Integrated (10/02/2023)  Tobacco Use: Low Risk  (10/02/2023)   SDOH Interventions:     Readmission Risk Interventions     No data to display

## 2023-10-03 NOTE — Progress Notes (Signed)
 PROGRESS NOTE    Sherri Garza  ZOX:[id number] DOB: 1941/07/09 DOA: 10/02/2023 PCP: Emilio Aspen, MD   Assessment & Plan:   Principal Problem:   Closed left hip fracture [facility]) Active Problems:   Fall at home, initial encounter   HTN (hypertension)   Diabetes mellitus without complication (HCC)   AKI (acute kidney injury) (HCC)   Leukocytosis   Memory loss  Assessment and Plan: Closed left hip fracture: secondary to fall at home. s/p left hip hemiarthroplasty as per ortho surg 10/03/23. Percocet, morphine prn for pain.    Fall: at home. PT/OT when ok w/ ortho surg    Leukocytosis: likely reactive, trending down   DM2: Recent A1c 6.5, well-controlled.  Continue on SSI w/ accuchecks    AKI: Cr is trending down from day prior.    Memory loss: continue on home dose of donepezil & seroquel         DVT prophylaxis: lovenox  Code Status: full  Family Communication: Disposition Plan: depends on PT/OT recs   Status is: Inpatient Severity of illness, had surg today     Level of care: Med-Surg Consultants:  Ortho surg   Procedures:   Antimicrobials:   Subjective: Pt c/o hip pain   Objective: Vitals:   10/02/23 2045 10/02/23 2100 10/02/23 2128 10/03/23 0759  BP:  (!) 168/76 (!) 175/84 (!) 164/75  Pulse:  (!) 104 100 86  Resp:   18 15  Temp:   98 F (36.7 C) 98 F (36.7 C)  TempSrc:    Oral  SpO2: 98%  99% 94%  Weight:       No intake or output data in the 24 hours ending 10/03/23 0859 Filed Weights   10/02/23 1753  Weight: 61.2 kg    Examination:  General exam: Appears calm and comfortable  Respiratory system: Clear to auscultation. Respiratory effort normal. Cardiovascular system: S1 & S2 +. No rubs, gallops or clicks. Gastrointestinal system: Abdomen is nondistended, soft and nontender. Normal bowel sounds heard. Central nervous system: Alert and awake. Moves all extremities  Psychiatry: Judgement and insight appears poor. Flat mood and  affect     Data Reviewed: I have personally reviewed following labs and imaging studies  CBC: Recent Labs  Lab 10/02/23 1755 10/03/23 0429  WBC 13.4* 11.6*  HGB 12.1 11.1*  HCT 36.7 33.6*  MCV 89.1 87.3  PLT 380 323   Basic Metabolic Panel: Recent Labs  Lab 10/02/23 1755 10/03/23 0429  NA 137 138  K 4.1 4.2  CL 100 104  CO2 24 25  GLUCOSE 248* 194*  BUN 22 18  CREATININE 1.07* 0.91  CALCIUM 9.2 8.8*   GFR: Estimated Creatinine Clearance: 41.7 mL/min (by C-G formula based on SCr of 0.91 mg/dL). Liver Function Tests: Recent Labs  Lab 10/02/23 1755  AST 32  ALT 22  ALKPHOS 62  BILITOT 0.7  PROT 7.3  ALBUMIN 4.0   No results for input(s): "LIPASE", "AMYLASE" in the last 168 hours. No results for input(s): "AMMONIA" in the last 168 hours. Coagulation Profile: Recent Labs  Lab 10/02/23 1755  INR 1.0   Cardiac Enzymes: No results for input(s): "CKTOTAL", "CKMB", "CKMBINDEX", "TROPONINI" in the last 168 hours. BNP (last 3 results) No results for input(s): "PROBNP" in the last 8760 hours. HbA1C: No results for input(s): "HGBA1C" in the last 72 hours. CBG: Recent Labs  Lab 10/02/23 2140 10/03/23 0759  GLUCAP 100* 175*   Lipid Profile: No results for input(s): "CHOL", "HDL", "  LDLCALC", "TRIG", "CHOLHDL", "LDLDIRECT" in the last 72 hours. Thyroid Function Tests: No results for input(s): "TSH", "T4TOTAL", "FREET4", "T3FREE", "THYROIDAB" in the last 72 hours. Anemia Panel: No results for input(s): "VITAMINB12", "FOLATE", "FERRITIN", "TIBC", "IRON", "RETICCTPCT" in the last 72 hours. Sepsis Labs: No results for input(s): "PROCALCITON", "LATICACIDVEN" in the last 168 hours.  No results found for this or any previous visit (from the past 240 hours).       Radiology Studies: CT HEAD WO CONTRAST ( ) Result Date: 10/02/2023 CLINICAL DATA:  Status post fall. EXAM: CT HEAD WITHOUT CONTRAST TECHNIQUE: Contiguous axial images were obtained from the base of  the skull through the vertex without intravenous contrast. RADIATION DOSE REDUCTION: This exam was performed according to the departmental dose-optimization program which includes automated exposure control, adjustment of the mA and/or kV according to patient size and/or use of iterative reconstruction technique. COMPARISON:  None Available. FINDINGS: Brain: There is generalized cerebral atrophy with widening of the extra-axial spaces and ventricular dilatation. There are areas of decreased attenuation within the white matter tracts of the supratentorial brain, consistent with microvascular disease changes. Vascular: Marked severity bilateral cavernous carotid artery calcification is noted. Skull: Normal. Negative for fracture or focal lesion. Sinuses/Orbits: No acute finding. Other: None. IMPRESSION: 1. No acute intracranial abnormality. 2. Generalized cerebral atrophy and microvascular disease changes of the supratentorial brain. Electronically Signed   By: Aram Candela M.D.   On: 10/02/2023 22:33   DG Chest Portable 1 View Result Date: 10/02/2023 CLINICAL DATA:  Preoperative evaluation. EXAM: PORTABLE CHEST 1 VIEW COMPARISON:  November 14, 2021 FINDINGS: The heart size and mediastinal contours are within normal limits. There is no evidence of an acute infiltrate, pleural effusion or pneumothorax. Radiopaque surgical clips are seen along the right axilla. Multilevel degenerative changes are noted throughout the thoracic spine. IMPRESSION: No active cardiopulmonary disease. Electronically Signed   By: Aram Candela M.D.   On: 10/02/2023 22:31   DG Hip Unilat W or Wo Pelvis 2-3 Views Left Result Date: 10/02/2023 CLINICAL DATA:  Recent fall with left hip pain, initial encounter EXAM: DG HIP (WITH OR WITHOUT PELVIS) 3V LEFT COMPARISON:  None Available. FINDINGS: Subcapital left femoral neck fracture is noted with impaction and angulation at the fracture site. Pelvic ring is intact. Prior right hip  replacement is noted. IMPRESSION: Subcapital left femoral neck fracture. Electronically Signed   By: Alcide Clever M.D.   On: 10/02/2023 21:38        Scheduled Meds:  donepezil  10 mg Oral QHS   [START ON 10/04/2023] feeding supplement  237 mL Oral BID BM   insulin aspart  0-5 Units Subcutaneous QHS   insulin aspart  0-9 Units Subcutaneous TID WC   lidocaine  1 patch Transdermal Q24H   multivitamin with minerals  1 tablet Oral Daily   mupirocin ointment  1 Application Nasal BID   Continuous Infusions:   LOS: 1 day       Charise Killian, MD [facility] Pager [phone]  If 7PM-7AM, please contact night-coverage [email or web address] 10/03/2023, 8:59 AM

## 2023-10-04 DIAGNOSIS — S72002A Fracture of unspecified part of neck of left femur, initial encounter for closed fracture: Secondary | ICD-10-CM | POA: Diagnosis not present

## 2023-10-04 LAB — BASIC METABOLIC PANEL
Anion gap: 11 (ref 5–15)
BUN: 19 mg/dL (ref 8–23)
CO2: 22 mmol/L (ref 22–32)
Calcium: 7.8 mg/dL — ABNORMAL LOW (ref 8.9–10.3)
Chloride: 100 mmol/L (ref 98–111)
Creatinine, Ser: 1.12 mg/dL — ABNORMAL HIGH (ref 0.44–1.00)
GFR, Estimated: 49 mL/min — ABNORMAL LOW (ref >60)
Glucose, Bld: 224 mg/dL — ABNORMAL HIGH (ref 70–99)
Potassium: 3.5 mmol/L (ref 3.5–5.1)
Sodium: 133 mmol/L — ABNORMAL LOW (ref 135–145)

## 2023-10-04 LAB — GLUCOSE, CAPILLARY
Glucose-Capillary: 210 mg/dL — ABNORMAL HIGH (ref 70–99)
Glucose-Capillary: 243 mg/dL — ABNORMAL HIGH (ref 70–99)
Glucose-Capillary: 176 mg/dL — ABNORMAL HIGH (ref 70–99)
Glucose-Capillary: 190 mg/dL — ABNORMAL HIGH (ref 70–99)

## 2023-10-04 LAB — CBC
HCT: 26.9 % — ABNORMAL LOW (ref 36.0–46.0)
Hemoglobin: 9.1 g/dL — ABNORMAL LOW (ref 12.0–15.0)
MCH: 29.6 pg (ref 26.0–34.0)
MCHC: 33.8 g/dL (ref 30.0–36.0)
MCV: 87.6 fL (ref 80.0–100.0)
Platelets: 222 10*3/uL (ref 150–400)
RBC: 3.07 MIL/uL — ABNORMAL LOW (ref 3.87–5.11)
RDW: 13 % (ref 11.5–15.5)
WBC: 10.1 10*3/uL (ref 4.0–10.5)
nRBC: 0 % (ref 0.0–0.2)

## 2023-10-04 MED ORDER — HALOPERIDOL LACTATE 5 MG/ML IJ SOLN
0.5000 mg | Freq: Once | INTRAMUSCULAR | Status: AC
  Administered 2023-10-04: 0.5 mg via INTRAVENOUS
  Filled 2023-10-04: qty 1

## 2023-10-04 MED ORDER — TRAZODONE HCL 50 MG PO TABS
50.0000 mg | ORAL_TABLET | Freq: Once | ORAL | Status: AC
  Administered 2023-10-04: 50 mg via ORAL
  Filled 2023-10-04: qty 1

## 2023-10-04 NOTE — Plan of Care (Signed)
  Problem: Education: Goal: Ability to describe self-care measures that may prevent or decrease complications (Diabetes Survival Skills Education) will improve Outcome: Not Progressing Goal: Individualized Educational Video(s) Outcome: Not Progressing   Problem: Coping: Goal: Ability to adjust to condition or change in health will improve Outcome: Not Progressing   Problem: Health Behavior/Discharge Planning: Goal: Ability to identify and utilize available resources and services will improve Outcome: Not Progressing Goal: Ability to manage health-related needs will improve Outcome: Not Progressing   Problem: Metabolic: Goal: Ability to maintain appropriate glucose levels will improve Outcome: Not Progressing   Problem: Education: Goal: Knowledge of General Education information will improve Description: Including pain rating scale, medication(s)/side effects and non-pharmacologic comfort measures Outcome: Not Progressing   Problem: Health Behavior/Discharge Planning: Goal: Ability to manage health-related needs will improve Outcome: Not Progressing   Problem: Clinical Measurements: Goal: Ability to maintain clinical measurements within normal limits will improve Outcome: Not Progressing Goal: Will remain free from infection Outcome: Not Progressing Goal: Diagnostic test results will improve Outcome: Not Progressing Goal: Respiratory complications will improve Outcome: Not Progressing Goal: Cardiovascular complication will be avoided Outcome: Not Progressing   Problem: Activity: Goal: Risk for activity intolerance will decrease Outcome: Not Progressing   Problem: Nutrition: Goal: Adequate nutrition will be maintained Outcome: Not Progressing   Problem: Coping: Goal: Level of anxiety will decrease Outcome: Not Progressing   Problem: Pain Managment: Goal: General experience of comfort will improve and/or be controlled Outcome: Not Progressing   Problem:  Safety: Goal: Ability to remain free from injury will improve Outcome: Not Progressing   Problem: Skin Integrity: Goal: Risk for impaired skin integrity will decrease Outcome: Not Progressing   Problem: Education: Goal: Knowledge of the prescribed therapeutic regimen will improve Outcome: Not Progressing Goal: Understanding of discharge needs will improve Outcome: Not Progressing Goal: Individualized Educational Video(s) Outcome: Not Progressing   Problem: Activity: Goal: Ability to avoid complications of mobility impairment will improve Outcome: Not Progressing Goal: Ability to tolerate increased activity will improve Outcome: Not Progressing   Problem: Pain Management: Goal: Pain level will decrease with appropriate interventions Outcome: Not Progressing   Problem: Fluid Volume: Goal: Ability to maintain a balanced intake and output will improve Outcome: Progressing   Problem: Nutritional: Goal: Maintenance of adequate nutrition will improve Outcome: Progressing Goal: Progress toward achieving an optimal weight will improve Outcome: Progressing   Problem: Skin Integrity: Goal: Risk for impaired skin integrity will decrease Outcome: Progressing   Problem: Tissue Perfusion: Goal: Adequacy of tissue perfusion will improve Outcome: Progressing   Problem: Elimination: Goal: Will not experience complications related to bowel motility Outcome: Progressing Goal: Will not experience complications related to urinary retention Outcome: Progressing   Problem: Clinical Measurements: Goal: Postoperative complications will be avoided or minimized Outcome: Progressing   Problem: Skin Integrity: Goal: Will show signs of wound healing Outcome: Progressing

## 2023-10-04 NOTE — Anesthesia Postprocedure Evaluation (Signed)
 Anesthesia Post Note  Patient: Sherri Garza  Procedure(s) Performed: HEMIARTHROPLASTY (BIPOLAR) HIP, POSTERIOR APPROACH FOR FRACTURE (Left: Hip)  Patient location during evaluation: Nursing Unit Anesthesia Type: Spinal Level of consciousness: lethargic Pain management: pain level controlled Vital Signs Assessment: post-procedure vital signs reviewed and stable Respiratory status: spontaneous breathing, respiratory function stable and patient connected to nasal cannula oxygen Cardiovascular status: blood pressure returned to baseline and stable Postop Assessment: no headache, no backache and no apparent nausea or vomiting Anesthetic complications: no Comments: Patient fairly drowsy, a little slow to answer (however I did see her early morning). No issues or complaints from her or her spouse.   No notable events documented.   Last Vitals:  Vitals:   10/04/23 0343 10/04/23 0754  BP: 118/68 (!) 142/75  Pulse: 89 95  Resp: 18 16  Temp: 37.1 C 36.9 C  SpO2: 93% 92%    Last Pain:  Vitals:   10/04/23 0351  TempSrc:   PainSc: Asleep                 Corinda Gubler

## 2023-10-04 NOTE — Progress Notes (Signed)
 PROGRESS NOTE    Sherri Garza  ZOX:[id number] DOB: 1940-08-27 DOA: 10/02/2023 PCP: Emilio Aspen, MD   Assessment & Plan:   Principal Problem:   Closed left hip fracture [facility]) Active Problems:   Fall at home, initial encounter   HTN (hypertension)   Diabetes mellitus without complication (HCC)   AKI (acute kidney injury) (HCC)   Leukocytosis   Memory loss  Assessment and Plan: Closed left hip fracture: secondary to fall at home. S/p left hip hemiarthroplasty as per ortho surg 10/03/23. Percocet, morphine prn    Fall: at home. PT/OT consulted    Leukocytosis: resolved   DM2: Recent A1c 6.5, well-controlled. Continue on SSI w/ accuchecks   AKI: Cr is labile. Avoid nephrotoxic meds    Memory loss: continue on home dose of donepezil & seroquel         DVT prophylaxis: lovenox  Code Status: full  Family Communication: Disposition Plan: depends on PT/OT recs   Status is: Inpatient Remains inpatient appropriate because: s/p hip surg and now needs to work w/ therapy   Level of care: Med-Surg Consultants:  Ortho surg   Procedures:   Antimicrobials:   Subjective: Pt c/o hip pain. Pt is confused   Objective: Vitals:   10/03/23 2128 10/03/23 2351 10/04/23 0343 10/04/23 0754  BP: (!) 153/81 122/66 118/68 (!) 142/75  Pulse: (!) 108 96 89 95  Resp: 18 18 18 16   Temp: 99.7 F (37.6 C) 98.4 F (36.9 C) 98.8 F (37.1 C) 98.4 F (36.9 C)  TempSrc: Oral Oral    SpO2: 91% 94% 93% 92%  Weight:        Intake/Output Summary (Last 24 hours) at 10/04/2023 0820 Last data filed at 10/04/2023 8119 Gross per 24 hour  Intake 400 ml  Output 100 ml  Net 300 ml   Filed Weights   10/02/23 1753  Weight: 61.2 kg    Examination:  General exam: appears tearful  Respiratory system: clear breath sounds b/l  Cardiovascular system: S1/S2+. No rubs or gallops Gastrointestinal system: Abd is soft, NT, ND & hypoactive  Central nervous system: alert & awake. Moves all  extremities  Psychiatry: Judgement and insight appears poor. Tearful mood and affect    Data Reviewed: I have personally reviewed following labs and imaging studies  CBC: Recent Labs  Lab 10/02/23 1755 10/03/23 0429 10/04/23 0355  WBC 13.4* 11.6* 10.1  HGB 12.1 11.1* 9.1*  HCT 36.7 33.6* 26.9*  MCV 89.1 87.3 87.6  PLT 380 323 222   Basic Metabolic Panel: Recent Labs  Lab 10/02/23 1755 10/03/23 0429 10/04/23 0355  NA 137 138 133*  K 4.1 4.2 3.5  CL 100 104 100  CO2 24 25 22   GLUCOSE 248* 194* 224*  BUN 22 18 19   CREATININE 1.07* 0.91 1.12*  CALCIUM 9.2 8.8* 7.8*   GFR: Estimated Creatinine Clearance: 33.9 mL/min (A) (by C-G formula based on SCr of 1.12 mg/dL (H)). Liver Function Tests: Recent Labs  Lab 10/02/23 1755  AST 32  ALT 22  ALKPHOS 62  BILITOT 0.7  PROT 7.3  ALBUMIN 4.0   No results for input(s): "LIPASE", "AMYLASE" in the last 168 hours. No results for input(s): "AMMONIA" in the last 168 hours. Coagulation Profile: Recent Labs  Lab 10/02/23 1755  INR 1.0   Cardiac Enzymes: No results for input(s): "CKTOTAL", "CKMB", "CKMBINDEX", "TROPONINI" in the last 168 hours. BNP (last 3 results) No results for input(s): "PROBNP" in the last 8760 hours. HbA1C: No  results for input(s): "HGBA1C" in the last 72 hours. CBG: Recent Labs  Lab 10/03/23 1039 10/03/23 1407 10/03/23 1544 10/03/23 2136 10/04/23 0756  GLUCAP 165* 161* 159* 246* 210*   Lipid Profile: No results for input(s): "CHOL", "HDL", "LDLCALC", "TRIG", "CHOLHDL", "LDLDIRECT" in the last 72 hours. Thyroid Function Tests: No results for input(s): "TSH", "T4TOTAL", "FREET4", "T3FREE", "THYROIDAB" in the last 72 hours. Anemia Panel: No results for input(s): "VITAMINB12", "FOLATE", "FERRITIN", "TIBC", "IRON", "RETICCTPCT" in the last 72 hours. Sepsis Labs: No results for input(s): "PROCALCITON", "LATICACIDVEN" in the last 168 hours.  No results found for this or any previous visit (from  the past 240 hours).       Radiology Studies: DG Pelvis Portable Result Date: 10/03/2023 CLINICAL DATA:  Status post left hip hemiarthroplasty. EXAM: PORTABLE PELVIS 1-2 VIEWS COMPARISON:  AP view of the bilateral hips 07/08/2023, pelvis and left hip radiographs 10/02/2023 FINDINGS: Redemonstration of total right hip arthroplasty. New left hip hemiarthroplasty. No perihardware lucency is seen to indicate hardware failure or loosening. Expected postoperative changes including subcutaneous airabout the left hip and lateral left hip surgical skin staples. IMPRESSION: Interval left hip hemiarthroplasty without evidence of hardware failure. Electronically Signed   By: Neita Garnet M.D.   On: 10/03/2023 14:34   DG HIP PORT UNILAT WITH PELVIS 1V LEFT Result Date: 10/03/2023 CLINICAL DATA:  Intraoperative radiograph of left hip hemiarthroplasty. EXAM: DG HIP (WITH OR WITHOUT PELVIS) 1V PORT LEFT COMPARISON:  Pelvis and left hip radiographs 10/02/2023 FINDINGS: There is diffuse decreased bone mineralization. Prior total right hip arthroplasty. The patient is undergoing total left hip arthroplasty with placement of preliminary femoral stem and head. Postsurgical changes of left hip subcutaneous air. IMPRESSION: Intraoperative radiograph during total left hip arthroplasty. Electronically Signed   By: Neita Garnet M.D.   On: 10/03/2023 14:28   CT HEAD WO CONTRAST ( ) Result Date: 10/02/2023 CLINICAL DATA:  Status post fall. EXAM: CT HEAD WITHOUT CONTRAST TECHNIQUE: Contiguous axial images were obtained from the base of the skull through the vertex without intravenous contrast. RADIATION DOSE REDUCTION: This exam was performed according to the departmental dose-optimization program which includes automated exposure control, adjustment of the mA and/or kV according to patient size and/or use of iterative reconstruction technique. COMPARISON:  None Available. FINDINGS: Brain: There is generalized cerebral atrophy  with widening of the extra-axial spaces and ventricular dilatation. There are areas of decreased attenuation within the white matter tracts of the supratentorial brain, consistent with microvascular disease changes. Vascular: Marked severity bilateral cavernous carotid artery calcification is noted. Skull: Normal. Negative for fracture or focal lesion. Sinuses/Orbits: No acute finding. Other: None. IMPRESSION: 1. No acute intracranial abnormality. 2. Generalized cerebral atrophy and microvascular disease changes of the supratentorial brain. Electronically Signed   By: Aram Candela M.D.   On: 10/02/2023 22:33   DG Chest Portable 1 View Result Date: 10/02/2023 CLINICAL DATA:  Preoperative evaluation. EXAM: PORTABLE CHEST 1 VIEW COMPARISON:  November 14, 2021 FINDINGS: The heart size and mediastinal contours are within normal limits. There is no evidence of an acute infiltrate, pleural effusion or pneumothorax. Radiopaque surgical clips are seen along the right axilla. Multilevel degenerative changes are noted throughout the thoracic spine. IMPRESSION: No active cardiopulmonary disease. Electronically Signed   By: Aram Candela M.D.   On: 10/02/2023 22:31   DG Hip Unilat W or Wo Pelvis 2-3 Views Left Result Date: 10/02/2023 CLINICAL DATA:  Recent fall with left hip pain, initial encounter EXAM: DG HIP (WITH OR  WITHOUT PELVIS) 3V LEFT COMPARISON:  None Available. FINDINGS: Subcapital left femoral neck fracture is noted with impaction and angulation at the fracture site. Pelvic ring is intact. Prior right hip replacement is noted. IMPRESSION: Subcapital left femoral neck fracture. Electronically Signed   By: Alcide Clever M.D.   On: 10/02/2023 21:38        Scheduled Meds:  acetaminophen  1,000 mg Oral Q8H   docusate sodium  100 mg Oral BID   donepezil  10 mg Oral QHS   enoxaparin (LOVENOX) injection  40 mg Subcutaneous Q24H   feeding supplement  237 mL Oral BID BM   insulin aspart  0-5 Units  Subcutaneous QHS   insulin aspart  0-9 Units Subcutaneous TID WC   ketorolac  7.5 mg Intravenous Q6H   multivitamin with minerals  1 tablet Oral Daily   mupirocin ointment  1 Application Nasal BID   senna  1 tablet Oral BID   Continuous Infusions:   LOS: 2 days       Charise Killian, MD [facility] Pager [phone]  If 7PM-7AM, please contact night-coverage [email or web address] 10/04/2023, 8:20 AM

## 2023-10-04 NOTE — Evaluation (Signed)
 Physical Therapy Evaluation Patient Details Name: Sherri Garza MRN: [id number] DOB: August 25, 1940 Today's Date: 10/04/2023  History of Present Illness  Sherri Garza is a 83 y.o. female who sustained a displaced femoral neck fracture after a fall.  Pt received a L hip hemiarthroplasty on 10/04/23.  Pt has PMH including:  medical history significant of DM, HTN, memory loss, depression, IBS, breast cancer, Bell's palsy, leukocytosis.  Clinical Impression  Pt receiving medications from nursing staff upon arrival to the room.  Pt's husband in room and able to assist with PLOF and living situation.  Pt agreeable to therapy.  Pt performed a few exercises, however noted increased pain upon performing.  Pt then assisted with maxA from therapist to come upright at EOB.  Pt required only modA to come upright into standing, however once in standing, pt with significant difficulty due to tremor of the R UE and the walker being more unsteady because of the tremor.  Pt was able to perform STS x3, with the final attempt, performing side shuffling towards the [facility] for improved positioning in the bed.  Pt then required maxA from therapist to return to supine position.  Therapist attempted to replace purewick, however pt grabbing therapist and scratching arm of therapist to prevent this from occurring.  Nursing notified of purewick needing to be adjusted, however pt not allowing therapist to perform.  Pt resting peacefully at conclusion of session.  Pt left with all needs met and call bell within reach.        If plan is discharge home, recommend the following: A lot of help with walking and/or transfers;A lot of help with bathing/dressing/bathroom;Direct supervision/assist for medications management;Assistance with cooking/housework;Assist for transportation;Help with stairs or ramp for entrance   Can travel by private vehicle   No    Equipment Recommendations None recommended by PT  Recommendations for Other  Services       Functional Status Assessment Patient has had a recent decline in their functional status and demonstrates the ability to make significant improvements in function in a reasonable and predictable amount of time.     Precautions / Restrictions Precautions Precautions: Posterior Hip Precaution Booklet Issued: No Recall of Precautions/Restrictions: Impaired Precaution/Restrictions Comments: Posterior hip precautions, however pt unable to verbalize or recall. Required Braces or Orthoses: Knee Immobilizer - Right Restrictions Weight Bearing Restrictions Per Provider Order: Yes LLE Weight Bearing Per Provider Order: Weight bearing as tolerated      Mobility  Bed Mobility Overal bed mobility: Needs Assistance Bed Mobility: Supine to Sit     Supine to sit: Max assist          Transfers Overall transfer level: Needs assistance Equipment used: Rolling walker (2 wheels) Transfers: Sit to/from Stand Sit to Stand: Mod assist           General transfer comment: Pt able to stand from slightly elevated bed, with modA from therapist.  Pt unable to bring foot forward, however is able to minimally slide towards the [facility].    Ambulation/Gait               General Gait Details: deferred due to instability.  Stairs            Wheelchair Mobility     Tilt Bed    Modified Rankin (Stroke Patients Only)       Balance Overall balance assessment: Needs assistance Sitting-balance support: Bilateral upper extremity supported, Feet supported Sitting balance-Leahy Scale: Poor     Standing balance  support: Bilateral upper extremity supported, Reliant on assistive device for balance Standing balance-Leahy Scale: Poor Standing balance comment: Pt requires UE support to stand and keep balance.                             Pertinent Vitals/Pain      Home Living Family/patient expects to be discharged to:: Private residence Living Arrangements:  Spouse/significant other Available Help at Discharge: Available 24 hours/day;Family Type of Home: House Home Access: Ramped entrance       Home Layout: Two level;Full bath on main level Home Equipment: Rolling Walker (2 wheels);Rollator (4 wheels);Grab bars - tub/shower;Hand held shower head;Cane - single point;Transport chair;BSC/3in1      Prior Function Prior Level of Function : Independent/Modified Independent             Mobility Comments: Pt was mobilizing well before the fall and was able to go up/down stairs without any complications.       Extremity/Trunk Assessment   Upper Extremity Assessment Upper Extremity Assessment: Generalized weakness;RUE deficits/detail RUE Deficits / Details: R UE tremor    Lower Extremity Assessment Lower Extremity Assessment: Generalized weakness;LLE deficits/detail LLE Deficits / Details: weakness/pain s/p L hemiarthroplasty LLE: Unable to fully assess due to pain       Communication        Cognition       PT - Cognitive impairments: History of cognitive impairments, Memory                                 Cueing       General Comments      Exercises     Assessment/Plan    PT Assessment Patient needs continued PT services  PT Problem List Decreased strength;Decreased range of motion;Decreased activity tolerance;Decreased balance;Decreased mobility;Decreased cognition;Decreased knowledge of use of DME;Decreased safety awareness;Decreased knowledge of precautions       PT Treatment Interventions DME instruction;Gait training;Functional mobility training;Therapeutic activities;Therapeutic exercise;Balance training;Neuromuscular re-education    PT Goals (Current goals can be found in the Care Plan section)  Acute Rehab PT Goals PT Goal Formulation: Patient unable to participate in goal setting Time For Goal Achievement: 10/04/23    Frequency 7X/week     Co-evaluation               AM-PAC PT  "6 Clicks" Mobility  Outcome Measure Help needed turning from your back to your side while in a flat bed without using bedrails?: A Lot Help needed moving from lying on your back to sitting on the side of a flat bed without using bedrails?: A Lot Help needed moving to and from a bed to a chair (including a wheelchair)?: A Lot Help needed standing up from a chair using your arms (e.g., wheelchair or bedside chair)?: A Lot Help needed to walk in hospital room?: Total Help needed climbing 3-5 steps with a railing? : Total 6 Click Score: 10    End of Session Equipment Utilized During Treatment: Gait belt Activity Tolerance: Patient limited by pain Patient left: in bed;with call bell/phone within reach;with bed alarm set Nurse Communication: Mobility status PT Visit Diagnosis: Unsteadiness on feet (R26.81);Repeated falls (R29.6);Muscle weakness (generalized) (M62.81);Other abnormalities of gait and mobility (R26.89);History of falling (Z91.81);Difficulty in walking, not elsewhere classified (R26.2);Pain Pain - Right/Left: Left Pain - part of body: Hip    Time: 9604-5409 PT Time Calculation (min) (  ACUTE ONLY): 35 min   Charges:   PT Evaluation $PT Eval Moderate Complexity: 1 Mod PT Treatments $Therapeutic Activity: 23-37 mins PT General Charges $$ ACUTE PT VISIT: 1 Visit         Nolon Bussing, PT, DPT Physical Therapist - [facility]  10/04/23, 2:36 PM

## 2023-10-04 NOTE — Evaluation (Signed)
 Occupational Therapy Evaluation Patient Details Name: Sherri Garza MRN: [id number] DOB: [birth year] Today's Date: 10/04/2023   History of Present Illness   Sherri Garza is a 83 y.o. female who sustained a displaced femoral neck fracture after a fall.  Pt received a L hip hemiarthroplasty on 10/04/23.  Pt has PMH including:  medical history significant of DM, HTN, memory loss, depression, IBS, breast cancer, Bell's palsy, leukocytosis.     Clinical Impressions Pt is agitated and confused throughout session, unable to provide any hx of PLOF, husband not present. Pt is oriented to self only. She is able to transfer from bed to [facility] with Max A +2, unable to follow simple directions. Pt able to side-scoot in sitting towards EOB with verbal and tactile cueing + demonstration. Provided educ re: posterior hip precautions; however, no evidence of pt retaining this information. Left handout outlining precautions in pt's room. Based on chart review, it appears pt resides in a [facility] and should be able to receive rehab services there following DC. Recommend ongoing OT while hospitalized.     If plan is discharge home, recommend the following:   A lot of help with walking and/or transfers;A lot of help with bathing/dressing/bathroom;Assistance with feeding;Assist for transportation;Direct supervision/assist for medications management;Assistance with cooking/housework;Supervision due to cognitive status     Functional Status Assessment   Patient has had a recent decline in their functional status and demonstrates the ability to make significant improvements in function in a reasonable and predictable amount of time.     Equipment Recommendations   None recommended by OT     Recommendations for Other Services         Precautions/Restrictions   Precautions Precautions: Posterior Hip Recall of Precautions/Restrictions: Impaired Precaution/Restrictions Comments: Posterior hip precautions,  however pt unable to verbalize or recall. Required Braces or Orthoses: Knee Immobilizer - Left Restrictions Weight Bearing Restrictions Per Provider Order: Yes LLE Weight Bearing Per Provider Order: Weight bearing as tolerated     Mobility Bed Mobility Overal bed mobility: Needs Assistance Bed Mobility: Supine to Sit, Sit to Supine     Supine to sit: Max assist Sit to supine: Max assist        Transfers Overall transfer level: Needs assistance Equipment used: Rolling walker (2 wheels) Transfers: Sit to/from Stand, Bed to chair/wheelchair/BSC Sit to Stand: Mod assist Stand pivot transfers: Max assist, +2 physical assistance                Balance Overall balance assessment: Needs assistance Sitting-balance support: Bilateral upper extremity supported, Feet supported Sitting balance-Leahy Scale: Poor     Standing balance support: Bilateral upper extremity supported, Reliant on assistive device for balance Standing balance-Leahy Scale: Poor                             ADL either performed or assessed with clinical judgement   ADL Overall ADL's : Needs assistance/impaired                         Toilet Transfer: Maximal assistance;+2 for physical assistance;BSC/3in1                   Vision         Perception         Praxis         Pertinent Vitals/Pain Pain Assessment Pain Assessment: Faces Faces Pain Scale: Hurts little more Pain Location:  back, L hip Pain Intervention(s): Premedicated before session, Repositioned, Ice applied, Monitored during session, Limited activity within patient's tolerance, Utilized relaxation techniques     Extremity/Trunk Assessment Upper Extremity Assessment Upper Extremity Assessment: Generalized weakness RUE Deficits / Details: R UE tremor   Lower Extremity Assessment Lower Extremity Assessment: Generalized weakness;LLE deficits/detail LLE Deficits / Details: weakness/pain s/p L  hemiarthroplasty LLE: Unable to fully assess due to pain       Communication Communication Communication: No apparent difficulties   Cognition Arousal: Alert Behavior During Therapy: Anxious Cognition: No family/caregiver present to determine baseline             OT - Cognition Comments: Pt is oriented to self only, unable to state where she is, unable to provide either month or year                 Following commands: Impaired       Cueing  General Comments   Cueing Techniques: Verbal cues;Tactile cues      Exercises Other Exercises Other Exercises: Pt highly agitated -- provided relaxation, calming exercises   Shoulder Instructions      Home Living Family/patient expects to be discharged to:: Private residence Living Arrangements: Spouse/significant other Available Help at Discharge: Available 24 hours/day;Family Type of Home: House Home Access: Ramped entrance     Home Layout: Two level;Full bath on main level     Bathroom Shower/Tub: Producer, television/film/video: Standard     Home Equipment: Agricultural consultant (2 wheels);Rollator (4 wheels);Grab bars - tub/shower;Hand held shower head;Cane - single point;Transport chair;BSC/3in1   Additional Comments: Pt not able to provide any history/home set up and family not present      Prior Functioning/Environment Prior Level of Function : Patient poor historian/Family not available                    OT Problem List: Decreased strength;Decreased range of motion;Decreased activity tolerance;Impaired balance (sitting and/or standing);Decreased safety awareness;Pain;Decreased cognition   OT Treatment/Interventions: Self-care/ADL training;Therapeutic exercise;Patient/family education;Balance training;Neuromuscular education;Energy conservation;Therapeutic activities;DME and/or AE instruction;Cognitive remediation/compensation      OT Goals(Current goals can be found in the care plan section)    Acute Rehab OT Goals Patient Stated Goal: Pt unable to state a goal this date Time For Goal Achievement: 10/18/23 Potential to Achieve Goals: Good ADL Goals Pt Will Perform Lower Body Dressing: with supervision;sit to/from stand;sitting/lateral leans Pt Will Transfer to Toilet: with mod assist;ambulating;regular height toilet Additional ADL Goal #1: Pt will INDly recall all posterior hip precautions   OT Frequency:  Min 2X/week    Co-evaluation              AM-PAC OT "6 Clicks" Daily Activity     Outcome Measure Help from another person eating meals?: A Lot Help from another person taking care of personal grooming?: A Lot Help from another person toileting, which includes using toliet, bedpan, or urinal?: A Lot Help from another person bathing (including washing, rinsing, drying)?: A Lot Help from another person to put on and taking off regular upper body clothing?: A Lot Help from another person to put on and taking off regular lower body clothing?: A Lot 6 Click Score: 12   End of Session Equipment Utilized During Treatment: Rolling walker (2 wheels)  Activity Tolerance: Patient tolerated treatment well Patient left: in bed;with call bell/phone within reach;with bed alarm set;with nursing/sitter in room  OT Visit Diagnosis: Unsteadiness on feet (R26.81);Other abnormalities of gait and  mobility (R26.89);Muscle weakness (generalized) (M62.81);Pain;Other symptoms and signs involving cognitive function Pain - Right/Left: Left Pain - part of body: Hip                Time: 3244-0102 OT Time Calculation (min): 33 min Charges:  OT General Charges $OT Visit: 1 Visit OT Evaluation $OT Eval Moderate Complexity: 1 Mod OT Treatments $Self Care/Home Management : 23-37 mins Latina Craver, PhD, MS, OTR/L 10/04/23, 3:32 PM

## 2023-10-04 NOTE — Progress Notes (Signed)
   Subjective: 1 Day Post-Op Procedure(s) (LRB): HEMIARTHROPLASTY (BIPOLAR) HIP, POSTERIOR APPROACH FOR FRACTURE (Left) Patient reports pain as mild.   Patient is well, and has had no acute complaints or problems Denies any CP, SOB, ABD pain. Start physical therapy today.    Objective: Vital signs in last 24 hours: Temp:  [97.7 F (36.5 C)-99.7 F (37.6 C)] 98.4 F (36.9 C) (03/08 0754) Pulse Rate:  [64-108] 95 (03/08 0754) Resp:  [16-20] 16 (03/08 0754) BP: (105-167)/(54-89) 142/75 (03/08 0754) SpO2:  [91 %-100 %] 92 % (03/08 0754)  Intake/Output from previous day: 03/07 0701 - 03/08 0700 In: 400 [IV Piggyback:400] Out: 100 [Blood:100] Intake/Output this shift: No intake/output data recorded.  Recent Labs    10/02/23 1755 10/03/23 0429 10/04/23 0355  HGB 12.1 11.1* 9.1*   Recent Labs    10/03/23 0429 10/04/23 0355  WBC 11.6* 10.1  RBC 3.85* 3.07*  HCT 33.6* 26.9*  PLT 323 222   Recent Labs    10/03/23 0429 10/04/23 0355  NA 138 133*  K 4.2 3.5  CL 104 100  CO2 25 22  BUN 18 19  CREATININE 0.91 1.12*  GLUCOSE 194* 224*  CALCIUM 8.8* 7.8*   Recent Labs    10/02/23 1755  INR 1.0    EXAM General - Patient is Alert, Appropriate, and Oriented Extremity - Neurovascular intact Sensation intact distally Intact pulses distally Dorsiflexion/Plantar flexion intact Dressing - dressing C/D/I.  Knee immobilizer intact to the knee. Motor Function - intact, moving foot and toes well on exam.   Past Medical History:  Diagnosis Date   Arthritis    Bell's palsy    2007 and 2019 brought on by stress   Breast cancer (HCC)    Depression    Diabetes mellitus without complication (HCC)    Glaucoma    HTN (hypertension)    IBS (irritable bowel syndrome)    Memory loss    short term   Vitamin D deficiency     Assessment/Plan:   1 Day Post-Op Procedure(s) (LRB): HEMIARTHROPLASTY (BIPOLAR) HIP, POSTERIOR APPROACH FOR FRACTURE (Left) Principal Problem:    Closed left hip fracture (HCC) Active Problems:   Fall at home, initial encounter   Diabetes mellitus without complication (HCC)   Memory loss   AKI (acute kidney injury) (HCC)   Leukocytosis   HTN (hypertension)  Estimated body mass index is 24.69 kg/m as calculated from the following:   Height as of 07/08/23: 5\' 2"  (1.575 m).   Weight as of this encounter: 61.2 kg. Advance diet Up with therapy Pain well-controlled Vital signs are stable Labs are stable, hemoglobin 9.1.  Continue to monitor Care management to assist with discharge    Lovenox 40mg /day x 4 weeks to start on POD#1 Posterior hip precautions.  Follow-up with [facility] orthopedics in 2 weeks for wound check    DVT Prophylaxis - Lovenox, TED hose, and SCDs Weight-Bearing as tolerated to left leg   T. Cranston Neighbor, PA-C [facility] 10/04/2023, 8:22 AM

## 2023-10-05 DIAGNOSIS — S72002A Fracture of unspecified part of neck of left femur, initial encounter for closed fracture: Secondary | ICD-10-CM | POA: Diagnosis not present

## 2023-10-05 LAB — GLUCOSE, CAPILLARY
Glucose-Capillary: 184 mg/dL — ABNORMAL HIGH (ref 70–99)
Glucose-Capillary: 222 mg/dL — ABNORMAL HIGH (ref 70–99)
Glucose-Capillary: 197 mg/dL — ABNORMAL HIGH (ref 70–99)
Glucose-Capillary: 219 mg/dL — ABNORMAL HIGH (ref 70–99)

## 2023-10-05 LAB — BASIC METABOLIC PANEL
Anion gap: 8 (ref 5–15)
BUN: 19 mg/dL (ref 8–23)
CO2: 23 mmol/L (ref 22–32)
Calcium: 8.1 mg/dL — ABNORMAL LOW (ref 8.9–10.3)
Chloride: 103 mmol/L (ref 98–111)
Creatinine, Ser: 0.99 mg/dL (ref 0.44–1.00)
GFR, Estimated: 57 mL/min — ABNORMAL LOW (ref >60)
Glucose, Bld: 174 mg/dL — ABNORMAL HIGH (ref 70–99)
Potassium: 3.6 mmol/L (ref 3.5–5.1)
Sodium: 134 mmol/L — ABNORMAL LOW (ref 135–145)

## 2023-10-05 LAB — CBC
HCT: 27.1 % — ABNORMAL LOW (ref 36.0–46.0)
Hemoglobin: 9.1 g/dL — ABNORMAL LOW (ref 12.0–15.0)
MCH: 29.1 pg (ref 26.0–34.0)
MCHC: 33.6 g/dL (ref 30.0–36.0)
MCV: 86.6 fL (ref 80.0–100.0)
Platelets: 226 10*3/uL (ref 150–400)
RBC: 3.13 MIL/uL — ABNORMAL LOW (ref 3.87–5.11)
RDW: 13.2 % (ref 11.5–15.5)
WBC: 9.8 10*3/uL (ref 4.0–10.5)
nRBC: 0 % (ref 0.0–0.2)

## 2023-10-05 MED ORDER — ENOXAPARIN SODIUM 40 MG/0.4ML IJ SOSY: 40.0000 mg | PREFILLED_SYRINGE | INTRAMUSCULAR | 0 refills | Status: DC

## 2023-10-05 MED ORDER — OXYCODONE HCL 5 MG PO TABS
2.5000 mg | ORAL_TABLET | ORAL | 0 refills | Status: DC | PRN
Start: 2023-10-05 — End: 2023-11-04

## 2023-10-05 NOTE — Progress Notes (Signed)
 Physical Therapy Treatment Patient Details Name: Sherri Garza MRN: [id number] DOB: [birth year] Today's Date: 10/05/2023   History of Present Illness Sherri Garza is a 83 y.o. female who sustained a displaced femoral neck fracture after a fall.  Pt received a L hip hemiarthroplasty on 10/04/23.  Pt has PMH including:  medical history significant of DM, HTN, memory loss, depression, IBS, breast cancer, Bell's palsy, leukocytosis.    PT Comments  Pt ready for session today.  Happy and relaxed.  When given time, she is able to scoot to edge of bed then needs min a x to transition to EOB.  Steady in sitting.  Stated she needed to void and offered BSC as purwic canister is mostly dry.  She is able to stand pivot to [facility] with min a x 1 and time to try on her own.  Voids quickly.  Urine dark not clear and no concerning odor.  She is able to transfer to recliner at bedside with min a x 1 and remains in chair with needs met.  Pt given water at end of session and encouraged to drink.  Communicated with nursing staff.  KI in place before and after session.     If plan is discharge home, recommend the following: A lot of help with walking and/or transfers;A lot of help with bathing/dressing/bathroom;Direct supervision/assist for medications management;Assistance with cooking/housework;Assist for transportation;Help with stairs or ramp for entrance   Can travel by private vehicle     No  Equipment Recommendations  None recommended by PT    Recommendations for Other Services       Precautions / Restrictions Precautions Precautions: Posterior Hip Recall of Precautions/Restrictions: Impaired Precaution/Restrictions Comments: Posterior hip precautions, however pt unable to verbalize or recall. Required Braces or Orthoses: Knee Immobilizer - Left Restrictions Weight Bearing Restrictions Per Provider Order: Yes LLE Weight Bearing Per Provider Order: Weight bearing as tolerated     Mobility  Bed  Mobility Overal bed mobility: Needs Assistance Bed Mobility: Supine to Sit     Supine to sit: Min assist     General bed mobility comments: assist for LE management but Patient Response: Cooperative  Transfers Overall transfer level: Needs assistance Equipment used: 1 person hand held assist Transfers: Sit to/from Stand, Bed to chair/wheelchair/BSC Sit to Stand: Min assist Stand pivot transfers: Min assist, Mod assist         General transfer comment: actually does quite well today wiht +1 assist and no fear/anxiety    Ambulation/Gait               General Gait Details: unable   Stairs             Wheelchair Mobility     Tilt Bed Tilt Bed Patient Response: Cooperative  Modified Rankin (Stroke Patients Only)       Balance Overall balance assessment: Needs assistance Sitting-balance support: Bilateral upper extremity supported, Feet supported Sitting balance-Leahy Scale: Fair     Standing balance support: Bilateral upper extremity supported, Reliant on assistive device for balance Standing balance-Leahy Scale: Poor Standing balance comment: Pt requires UE support to stand and keep balance.                            Communication Communication Communication: No apparent difficulties  Cognition Arousal: Alert Behavior During Therapy: WFL for tasks assessed/performed   PT - Cognitive impairments: History of cognitive impairments, Memory  Cueing    Exercises      General Comments        Pertinent Vitals/Pain Pain Assessment Pain Assessment: Faces Faces Pain Scale: Hurts a little bit Pain Location: back, L hip Pain Intervention(s): Monitored during session, Repositioned    Home Living                          Prior Function            PT Goals (current goals can now be found in the care plan section) Progress towards PT goals: Progressing toward goals     Frequency    7X/week      PT Plan      Co-evaluation              AM-PAC PT "6 Clicks" Mobility   Outcome Measure  Help needed turning from your back to your side while in a flat bed without using bedrails?: A Little Help needed moving from lying on your back to sitting on the side of a flat bed without using bedrails?: A Little Help needed moving to and from a bed to a chair (including a wheelchair)?: A Little Help needed standing up from a chair using your arms (e.g., wheelchair or bedside chair)?: A Little Help needed to walk in hospital room?: Total Help needed climbing 3-5 steps with a railing? : Total 6 Click Score: 14    End of Session Equipment Utilized During Treatment: Gait belt Activity Tolerance: Patient tolerated treatment well Patient left: in chair;with chair alarm set;with call bell/phone within reach Nurse Communication: Mobility status PT Visit Diagnosis: Unsteadiness on feet (R26.81);Repeated falls (R29.6);Muscle weakness (generalized) (M62.81);Other abnormalities of gait and mobility (R26.89);History of falling (Z91.81);Difficulty in walking, not elsewhere classified (R26.2);Pain Pain - Right/Left: Left Pain - part of body: Hip     Time: 1610-9604 PT Time Calculation (min) (ACUTE ONLY): 26 min  Charges:    $Therapeutic Activity: 8-22 mins PT General Charges $$ ACUTE PT VISIT: 1 Visit                   Danielle Dess, PTA 10/05/23, 11:11 AM

## 2023-10-05 NOTE — Progress Notes (Signed)
   Subjective: 2 Days Post-Op Procedure(s) (LRB): HEMIARTHROPLASTY (BIPOLAR) HIP, POSTERIOR APPROACH FOR FRACTURE (Left) Patient reports pain as mild.   Patient is well, and has had no acute complaints or problems Denies any CP, SOB, ABD pain. Continue with physical therapy today.    Objective: Vital signs in last 24 hours: Temp:  [97.9 F (36.6 C)-98.4 F (36.9 C)] 97.9 F (36.6 C) (03/09 0751) Pulse Rate:  [71-87] 71 (03/09 0751) Resp:  [16-20] 16 (03/09 0751) BP: (104-117)/(57-64) 116/64 (03/09 0751) SpO2:  [97 %-98 %] 98 % (03/09 0751)  Intake/Output from previous day: 03/08 0701 - 03/09 0700 In: 0  Out: 375 [Urine:375] Intake/Output this shift: No intake/output data recorded.  Recent Labs    10/02/23 1755 10/03/23 0429 10/04/23 0355 10/05/23 0600  HGB 12.1 11.1* 9.1* 9.1*   Recent Labs    10/04/23 0355 10/05/23 0600  WBC 10.1 9.8  RBC 3.07* 3.13*  HCT 26.9* 27.1*  PLT 222 226   Recent Labs    10/04/23 0355 10/05/23 0600  NA 133* 134*  K 3.5 3.6  CL 100 103  CO2 22 23  BUN 19 19  CREATININE 1.12* 0.99  GLUCOSE 224* 174*  CALCIUM 7.8* 8.1*   Recent Labs    10/02/23 1755  INR 1.0    EXAM General - Patient is Alert, Appropriate, and Confused Extremity - Neurovascular intact Sensation intact distally Intact pulses distally Dorsiflexion/Plantar flexion intact Negative Homans' sign bilaterally Dressing - dressing C/D/I.  Knee immobilizer intact to the knee. Motor Function - intact, moving foot and toes well on exam.   Past Medical History:  Diagnosis Date   Arthritis    Bell's palsy    2007 and 2019 brought on by stress   Breast cancer (HCC)    Depression    Diabetes mellitus without complication (HCC)    Glaucoma    HTN (hypertension)    IBS (irritable bowel syndrome)    Memory loss    short term   Vitamin D deficiency     Assessment/Plan:   2 Days Post-Op Procedure(s) (LRB): HEMIARTHROPLASTY (BIPOLAR) HIP, POSTERIOR APPROACH  FOR FRACTURE (Left) Principal Problem:   Closed left hip fracture (HCC) Active Problems:   Fall at home, initial encounter   Diabetes mellitus without complication (HCC)   Memory loss   AKI (acute kidney injury) (HCC)   Leukocytosis   HTN (hypertension)  Estimated body mass index is 24.69 kg/m as calculated from the following:   Height as of 07/08/23: 5\' 2"  (1.575 m).   Weight as of this encounter: 61.2 kg. Advance diet Up with therapy.  Slow progress with physical therapy yesterday. Pain well-controlled Vital signs are stable Labs are stable, hemoglobin 9.1.  Continue to monitor Care management to assist with discharge to skilled nursing facility    Lovenox 40mg /day x 4 weeks to start on POD#1 Posterior hip precautions.  Follow-up with [facility] orthopedics in 2 weeks for wound check    DVT Prophylaxis - Lovenox, TED hose, and SCDs Weight-Bearing as tolerated to left leg   T. Cranston Neighbor, PA-C [facility] 10/05/2023, 10:25 AM

## 2023-10-05 NOTE — Progress Notes (Signed)
 PROGRESS NOTE    Sherri Garza  RUE:[id number] DOB: November 04, 1940 DOA: 10/02/2023 PCP: Emilio Aspen, MD   Assessment & Plan:   Principal Problem:   Closed left hip fracture [facility]) Active Problems:   Fall at home, initial encounter   HTN (hypertension)   Diabetes mellitus without complication (HCC)   AKI (acute kidney injury) (HCC)   Leukocytosis   Memory loss  Assessment and Plan: Closed left hip fracture: secondary to fall at home. S/p left hip hemiarthroplasty as per ortho surg 10/03/23. PT/OT recs SNF. Ortho surg following and recs apprec  Normocytic anemia: w/ likely component of acute blood loss anemia secondary to recent surg. No need for a transfusion currently    Fall: at home. PT/OT recs SNF.    Leukocytosis: resolved   DM2: Recent A1c 6.5, well-controlled. Continue on SSI w/ accuchecks    AKI: Cr is labile. Avoid nephrotoxic meds    Memory loss: continue on home dose fo donepezil, seroquel. Needs to see neuro outpatient         DVT prophylaxis: lovenox  Code Status: full  Family Communication: Disposition Plan: likely d/c to SNF   Status is: Inpatient Remains inpatient appropriate because: needs SNF placement   Level of care: Med-Surg Consultants:  Ortho surg   Procedures:   Antimicrobials:   Subjective: Pt c/o fatigue.    Objective: Vitals:   10/04/23 0754 10/04/23 1509 10/04/23 2020 10/05/23 0751  BP: (!) 142/75 (!) 104/57 117/61 116/64  Pulse: 95 87 80 71  Resp: 16 16 20 16   Temp: 98.4 F (36.9 C) 98 F (36.7 C) 98.4 F (36.9 C) 97.9 F (36.6 C)  TempSrc:   Oral   SpO2: 92% 98% 97% 98%  Weight:        Intake/Output Summary (Last 24 hours) at 10/05/2023 1478 Last data filed at 10/04/2023 2000 Gross per 24 hour  Intake 0 ml  Output 375 ml  Net -375 ml   Filed Weights   10/02/23 1753  Weight: 61.2 kg    Examination:  General exam: appears calm & comfortable  Respiratory system: clear breath sounds b/l  Cardiovascular  system: S1 & S2+. No rubs or gallops  Gastrointestinal system: abd is soft, NT, ND & normal bowel sounds  Central nervous system: alert & awake. Moves all extremities  Psychiatry: judgement and insight appears poor. Flat mood and affect     Data Reviewed: I have personally reviewed following labs and imaging studies  CBC: Recent Labs  Lab 10/02/23 1755 10/03/23 0429 10/04/23 0355 10/05/23 0600  WBC 13.4* 11.6* 10.1 9.8  HGB 12.1 11.1* 9.1* 9.1*  HCT 36.7 33.6* 26.9* 27.1*  MCV 89.1 87.3 87.6 86.6  PLT 380 323 222 226   Basic Metabolic Panel: Recent Labs  Lab 10/02/23 1755 10/03/23 0429 10/04/23 0355 10/05/23 0600  NA 137 138 133* 134*  K 4.1 4.2 3.5 3.6  CL 100 104 100 103  CO2 24 25 22 23   GLUCOSE 248* 194* 224* 174*  BUN 22 18 19 19   CREATININE 1.07* 0.91 1.12* 0.99  CALCIUM 9.2 8.8* 7.8* 8.1*   GFR: Estimated Creatinine Clearance: 38.3 mL/min (by C-G formula based on SCr of 0.99 mg/dL). Liver Function Tests: Recent Labs  Lab 10/02/23 1755  AST 32  ALT 22  ALKPHOS 62  BILITOT 0.7  PROT 7.3  ALBUMIN 4.0   No results for input(s): "LIPASE", "AMYLASE" in the last 168 hours. No results for input(s): "AMMONIA" in the last 168  hours. Coagulation Profile: Recent Labs  Lab 10/02/23 1755  INR 1.0   Cardiac Enzymes: No results for input(s): "CKTOTAL", "CKMB", "CKMBINDEX", "TROPONINI" in the last 168 hours. BNP (last 3 results) No results for input(s): "PROBNP" in the last 8760 hours. HbA1C: No results for input(s): "HGBA1C" in the last 72 hours. CBG: Recent Labs  Lab 10/04/23 0756 10/04/23 1130 10/04/23 1658 10/04/23 2023 10/05/23 0754  GLUCAP 210* 243* 176* 190* 184*   Lipid Profile: No results for input(s): "CHOL", "HDL", "LDLCALC", "TRIG", "CHOLHDL", "LDLDIRECT" in the last 72 hours. Thyroid Function Tests: No results for input(s): "TSH", "T4TOTAL", "FREET4", "T3FREE", "THYROIDAB" in the last 72 hours. Anemia Panel: No results for input(s):  "VITAMINB12", "FOLATE", "FERRITIN", "TIBC", "IRON", "RETICCTPCT" in the last 72 hours. Sepsis Labs: No results for input(s): "PROCALCITON", "LATICACIDVEN" in the last 168 hours.  No results found for this or any previous visit (from the past 240 hours).       Radiology Studies: DG Pelvis Portable Result Date: 10/03/2023 CLINICAL DATA:  Status post left hip hemiarthroplasty. EXAM: PORTABLE PELVIS 1-2 VIEWS COMPARISON:  AP view of the bilateral hips 07/08/2023, pelvis and left hip radiographs 10/02/2023 FINDINGS: Redemonstration of total right hip arthroplasty. New left hip hemiarthroplasty. No perihardware lucency is seen to indicate hardware failure or loosening. Expected postoperative changes including subcutaneous airabout the left hip and lateral left hip surgical skin staples. IMPRESSION: Interval left hip hemiarthroplasty without evidence of hardware failure. Electronically Signed   By: Neita Garnet M.D.   On: 10/03/2023 14:34   DG HIP PORT UNILAT WITH PELVIS 1V LEFT Result Date: 10/03/2023 CLINICAL DATA:  Intraoperative radiograph of left hip hemiarthroplasty. EXAM: DG HIP (WITH OR WITHOUT PELVIS) 1V PORT LEFT COMPARISON:  Pelvis and left hip radiographs 10/02/2023 FINDINGS: There is diffuse decreased bone mineralization. Prior total right hip arthroplasty. The patient is undergoing total left hip arthroplasty with placement of preliminary femoral stem and head. Postsurgical changes of left hip subcutaneous air. IMPRESSION: Intraoperative radiograph during total left hip arthroplasty. Electronically Signed   By: Neita Garnet M.D.   On: 10/03/2023 14:28        Scheduled Meds:  acetaminophen  1,000 mg Oral Q8H   docusate sodium  100 mg Oral BID   donepezil  10 mg Oral QHS   enoxaparin (LOVENOX) injection  40 mg Subcutaneous Q24H   feeding supplement  237 mL Oral BID BM   insulin aspart  0-5 Units Subcutaneous QHS   insulin aspart  0-9 Units Subcutaneous TID WC   multivitamin with  minerals  1 tablet Oral Daily   mupirocin ointment  1 Application Nasal BID   senna  1 tablet Oral BID   Continuous Infusions:   LOS: 3 days       Charise Killian, MD [facility] Pager [phone]  If 7PM-7AM, please contact night-coverage [email or web address] 10/05/2023, 8:23 AM

## 2023-10-05 NOTE — Plan of Care (Signed)
  Problem: Education: Goal: Ability to describe self-care measures that may prevent or decrease complications (Diabetes Survival Skills Education) will improve Outcome: Progressing Goal: Individualized Educational Video(s) Outcome: Progressing   Problem: Coping: Goal: Ability to adjust to condition or change in health will improve Outcome: Progressing   Problem: Fluid Volume: Goal: Ability to maintain a balanced intake and output will improve Outcome: Progressing   Problem: Health Behavior/Discharge Planning: Goal: Ability to identify and utilize available resources and services will improve Outcome: Progressing Goal: Ability to manage health-related needs will improve Outcome: Progressing   Problem: Metabolic: Goal: Ability to maintain appropriate glucose levels will improve Outcome: Progressing   Problem: Nutritional: Goal: Maintenance of adequate nutrition will improve Outcome: Progressing Goal: Progress toward achieving an optimal weight will improve Outcome: Progressing   Problem: Skin Integrity: Goal: Risk for impaired skin integrity will decrease Outcome: Progressing   Problem: Tissue Perfusion: Goal: Adequacy of tissue perfusion will improve Outcome: Progressing   Problem: Education: Goal: Knowledge of General Education information will improve Description: Including pain rating scale, medication(s)/side effects and non-pharmacologic comfort measures Outcome: Progressing   Problem: Health Behavior/Discharge Planning: Goal: Ability to manage health-related needs will improve Outcome: Progressing   Problem: Clinical Measurements: Goal: Ability to maintain clinical measurements within normal limits will improve Outcome: Progressing Goal: Will remain free from infection Outcome: Progressing Goal: Diagnostic test results will improve Outcome: Progressing Goal: Respiratory complications will improve Outcome: Progressing Goal: Cardiovascular complication will  be avoided Outcome: Progressing   Problem: Activity: Goal: Risk for activity intolerance will decrease Outcome: Progressing   Problem: Nutrition: Goal: Adequate nutrition will be maintained Outcome: Progressing   Problem: Coping: Goal: Level of anxiety will decrease Outcome: Progressing   Problem: Elimination: Goal: Will not experience complications related to bowel motility Outcome: Progressing Goal: Will not experience complications related to urinary retention Outcome: Progressing   Problem: Pain Managment: Goal: General experience of comfort will improve and/or be controlled Outcome: Progressing   Problem: Safety: Goal: Ability to remain free from injury will improve Outcome: Progressing   Problem: Skin Integrity: Goal: Risk for impaired skin integrity will decrease Outcome: Progressing   Problem: Education: Goal: Knowledge of the prescribed therapeutic regimen will improve Outcome: Progressing Goal: Understanding of discharge needs will improve Outcome: Progressing Goal: Individualized Educational Video(s) Outcome: Progressing   Problem: Activity: Goal: Ability to avoid complications of mobility impairment will improve Outcome: Progressing Goal: Ability to tolerate increased activity will improve Outcome: Progressing   Problem: Clinical Measurements: Goal: Postoperative complications will be avoided or minimized Outcome: Progressing   Problem: Pain Management: Goal: Pain level will decrease with appropriate interventions Outcome: Progressing   Problem: Skin Integrity: Goal: Will show signs of wound healing Outcome: Progressing

## 2023-10-06 ENCOUNTER — Encounter: Payer: Self-pay | Admitting: Orthopedic Surgery

## 2023-10-06 DIAGNOSIS — S72002A Fracture of unspecified part of neck of left femur, initial encounter for closed fracture: Secondary | ICD-10-CM | POA: Diagnosis not present

## 2023-10-06 LAB — BASIC METABOLIC PANEL
Anion gap: 8 (ref 5–15)
BUN: 26 mg/dL — ABNORMAL HIGH (ref 8–23)
CO2: 24 mmol/L (ref 22–32)
Calcium: 8.2 mg/dL — ABNORMAL LOW (ref 8.9–10.3)
Chloride: 105 mmol/L (ref 98–111)
Creatinine, Ser: 0.98 mg/dL (ref 0.44–1.00)
GFR, Estimated: 58 mL/min — ABNORMAL LOW (ref >60)
Glucose, Bld: 196 mg/dL — ABNORMAL HIGH (ref 70–99)
Potassium: 3.6 mmol/L (ref 3.5–5.1)
Sodium: 137 mmol/L (ref 135–145)

## 2023-10-06 LAB — CBC
HCT: 25.9 % — ABNORMAL LOW (ref 36.0–46.0)
Hemoglobin: 8.6 g/dL — ABNORMAL LOW (ref 12.0–15.0)
MCH: 28.6 pg (ref 26.0–34.0)
MCHC: 33.2 g/dL (ref 30.0–36.0)
MCV: 86 fL (ref 80.0–100.0)
Platelets: 257 10*3/uL (ref 150–400)
RBC: 3.01 MIL/uL — ABNORMAL LOW (ref 3.87–5.11)
RDW: 13.2 % (ref 11.5–15.5)
WBC: 10 10*3/uL (ref 4.0–10.5)
nRBC: 0 % (ref 0.0–0.2)

## 2023-10-06 LAB — GLUCOSE, CAPILLARY
Glucose-Capillary: 337 mg/dL — ABNORMAL HIGH (ref 70–99)
Glucose-Capillary: 354 mg/dL — ABNORMAL HIGH (ref 70–99)

## 2023-10-06 NOTE — Plan of Care (Signed)
  Problem: Education: Goal: Ability to describe self-care measures that may prevent or decrease complications (Diabetes Survival Skills Education) will improve Outcome: Progressing   Problem: Coping: Goal: Ability to adjust to condition or change in health will improve Outcome: Progressing   Problem: Fluid Volume: Goal: Ability to maintain a balanced intake and output will improve Outcome: Progressing   Problem: Health Behavior/Discharge Planning: Goal: Ability to identify and utilize available resources and services will improve Outcome: Progressing Goal: Ability to manage health-related needs will improve Outcome: Progressing   Problem: Metabolic: Goal: Ability to maintain appropriate glucose levels will improve Outcome: Progressing   Problem: Nutritional: Goal: Maintenance of adequate nutrition will improve Outcome: Progressing Goal: Progress toward achieving an optimal weight will improve Outcome: Progressing   Problem: Skin Integrity: Goal: Risk for impaired skin integrity will decrease Outcome: Progressing   Problem: Tissue Perfusion: Goal: Adequacy of tissue perfusion will improve Outcome: Progressing   Problem: Education: Goal: Knowledge of General Education information will improve Description: Including pain rating scale, medication(s)/side effects and non-pharmacologic comfort measures Outcome: Progressing   Problem: Health Behavior/Discharge Planning: Goal: Ability to manage health-related needs will improve Outcome: Progressing   Problem: Clinical Measurements: Goal: Ability to maintain clinical measurements within normal limits will improve Outcome: Progressing Goal: Will remain free from infection Outcome: Progressing Goal: Diagnostic test results will improve Outcome: Progressing Goal: Respiratory complications will improve Outcome: Progressing Goal: Cardiovascular complication will be avoided Outcome: Progressing   Problem: Activity: Goal:  Risk for activity intolerance will decrease Outcome: Progressing   Problem: Nutrition: Goal: Adequate nutrition will be maintained Outcome: Progressing   Problem: Coping: Goal: Level of anxiety will decrease Outcome: Progressing   Problem: Elimination: Goal: Will not experience complications related to bowel motility Outcome: Progressing Goal: Will not experience complications related to urinary retention Outcome: Progressing   Problem: Pain Managment: Goal: General experience of comfort will improve and/or be controlled Outcome: Progressing   Problem: Safety: Goal: Ability to remain free from injury will improve Outcome: Progressing   Problem: Skin Integrity: Goal: Risk for impaired skin integrity will decrease Outcome: Progressing   Problem: Education: Goal: Knowledge of the prescribed therapeutic regimen will improve Outcome: Progressing Goal: Understanding of discharge needs will improve Outcome: Progressing Goal: Individualized Educational Video(s) Outcome: Progressing   Problem: Activity: Goal: Ability to avoid complications of mobility impairment will improve Outcome: Progressing Goal: Ability to tolerate increased activity will improve Outcome: Progressing   Problem: Clinical Measurements: Goal: Postoperative complications will be avoided or minimized Outcome: Progressing   Problem: Pain Management: Goal: Pain level will decrease with appropriate interventions Outcome: Progressing   Problem: Skin Integrity: Goal: Will show signs of wound healing Outcome: Progressing

## 2023-10-06 NOTE — Discharge Instructions (Signed)

## 2023-10-06 NOTE — Progress Notes (Addendum)
 Physical Therapy Treatment Patient Details Name: Sherri Garza MRN: [id number] DOB: [birth year] Today's Date: 10/06/2023   History of Present Illness Sherri Garza is a 83 y.o. female who sustained a displaced femoral neck fracture after a fall.  Pt received a L hip hemiarthroplasty on 10/04/23.  Pt has PMH including:  medical history significant of DM, HTN, memory loss, depression, IBS, breast cancer, Bell's palsy, leukocytosis.    PT Comments  Pt ready to get up.  Needs min a x 1 to move hips to EOB.  Steady in sitting.  Stands and transfers with RW to [facility] to void.  She is able to attend to her own self care needs.  She progresses gait 200' in hallway with RW and min a x 1 for cues to step into walker and general safety.  Husband in and discussed discharge plan.  Initial plan for SNF but husband stated she would prefer home if able.  At this time with mobility improvement and +1 assist he feels he is able to attend to her care at home.  He would like a BSC.  Will relay to team to follow up.    If plan is discharge home, recommend the following: Direct supervision/assist for medications management;Assistance with cooking/housework;Assist for transportation;Help with stairs or ramp for entrance;A little help with walking and/or transfers;A little help with bathing/dressing/bathroom   Can travel by private vehicle        Equipment Recommendations  BSC/3in1    Recommendations for Other Services       Precautions / Restrictions Precautions Precautions: Posterior Hip Recall of Precautions/Restrictions: Impaired Precaution/Restrictions Comments: Posterior hip precautions, however pt unable to verbalize or recall. Required Braces or Orthoses: Knee Immobilizer - Left Restrictions Weight Bearing Restrictions Per Provider Order: Yes LLE Weight Bearing Per Provider Order: Weight bearing as tolerated     Mobility  Bed Mobility Overal bed mobility: Needs Assistance Bed Mobility: Supine to  Sit     Supine to sit: Min assist       Patient Response: Cooperative  Transfers Overall transfer level: Needs assistance Equipment used: Rolling walker (2 wheels) Transfers: Sit to/from Stand Sit to Stand: Min assist Stand pivot transfers: Min assist              Ambulation/Gait Ambulation/Gait assistance: Min assist, Contact guard assist Gait Distance (Feet): 200 Feet Assistive device: Rolling walker (2 wheels) Gait Pattern/deviations: Step-through pattern, Decreased step length - right, Decreased step length - left, Trunk flexed       General Gait Details: completes lap   Stairs             Wheelchair Mobility     Tilt Bed Tilt Bed Patient Response: Cooperative  Modified Rankin (Stroke Patients Only)       Balance Overall balance assessment: Needs assistance Sitting-balance support: Bilateral upper extremity supported, Feet supported Sitting balance-Leahy Scale: Fair     Standing balance support: Bilateral upper extremity supported, Reliant on assistive device for balance Standing balance-Leahy Scale: Fair Standing balance comment: able to attend to self care needs at [facility]                            Communication Communication Communication: No apparent difficulties  Cognition Arousal: Alert Behavior During Therapy: WFL for tasks assessed/performed   PT - Cognitive impairments: History of cognitive impairments, Memory  Following commands: Impaired Following commands impaired: Follows one step commands with increased time    Cueing Cueing Techniques: Verbal cues, Tactile cues  Exercises      General Comments        Pertinent Vitals/Pain Pain Assessment Pain Assessment: Faces Faces Pain Scale: Hurts a little bit Pain Intervention(s): Monitored during session, Repositioned    Home Living                          Prior Function            PT Goals (current goals can now  be found in the care plan section) Progress towards PT goals: Progressing toward goals    Frequency    7X/week      PT Plan      Co-evaluation PT/OT/SLP Co-Evaluation/Treatment: Yes Reason for Co-Treatment: Complexity of the patient's impairments (multi-system involvement) PT goals addressed during session: Mobility/safety with mobility;Proper use of DME OT goals addressed during session: ADL's and self-care      AM-PAC PT "6 Clicks" Mobility   Outcome Measure  Help needed turning from your back to your side while in a flat bed without using bedrails?: A Little Help needed moving from lying on your back to sitting on the side of a flat bed without using bedrails?: A Little Help needed moving to and from a bed to a chair (including a wheelchair)?: A Little Help needed standing up from a chair using your arms (e.g., wheelchair or bedside chair)?: A Little Help needed to walk in hospital room?: A Little Help needed climbing 3-5 steps with a railing? : A Little 6 Click Score: 18    End of Session Equipment Utilized During Treatment: Gait belt Activity Tolerance: Patient tolerated treatment well Patient left: in chair;with chair alarm set;with call bell/phone within reach;with family/visitor present Nurse Communication: Mobility status PT Visit Diagnosis: Unsteadiness on feet (R26.81);Repeated falls (R29.6);Muscle weakness (generalized) (M62.81);Other abnormalities of gait and mobility (R26.89);History of falling (Z91.81);Difficulty in walking, not elsewhere classified (R26.2);Pain Pain - Right/Left: Left Pain - part of body: Hip     Time: 8119-1478 PT Time Calculation (min) (ACUTE ONLY): 35 min  Charges:    $Gait Training: 8-22 mins PT General Charges $$ ACUTE PT VISIT: 1 Visit                   Danielle Dess, PTA 10/06/23, 10:40 AM

## 2023-10-06 NOTE — Discharge Summary (Signed)
 Physician Discharge Summary  Sherri Garza:[id number] DOB: 28-Dec-1940 DOA: 10/02/2023  PCP: Emilio Aspen, MD  Admit date: 10/02/2023 Discharge date: 10/06/2023  Admitted From: home Disposition:  home w/ home health   Recommendations for Outpatient Follow-up:  Follow up with PCP in 1-2 weeks F/u w/ ortho surg, Dr. Allena Katz or PA Lenard Forth, in 2 weeks   Home Health: yes Equipment/Devices:  Discharge Condition: stable  CODE STATUS: full  Diet recommendation: Carb Modified  Brief/Interim Summary: HPI was taken from Dr. Clyde Lundborg:  Sherri Garza is a 83 y.o. female with medical history significant of DM, HTN, memory loss, depression, IBS, breast cancer, Bell's palsy, leukocytosis, who presents with fall and left hip pain.   Per patient and her husband at the bedside, she accidentally fell in the afternoon which she was walking in dining room and tripped for steps.  No loss of consciousness.  She injured her left hip, causing pain in left hip, which is constant, severe, sharp, nonradiating, aggravated by movement.  She does not headache or neck pain.  No cough, SOB, chest pain, nausea, vomiting, diarrhea, abdominal pain, symptoms of UTI.  No fever or chills.    Data reviewed independently and ED Course: pt was found to have WBC 13.4, mild AKI with creatinine 1.07, BUN 22 and GFR 52, (recent baseline creatinine 0.86 on 07/08/2023), temperature normal, blood pressure 169/71, heart rate 90, RR 18, oxygen saturation 100% on room air.  CXR negative.  CT of head negative for acute intracranial abnormalities.  X-ray of left hip/pelvis showed subcapital left femoral neck fracture. Patient is admitted to MedSurg bed as inpatient.  Dr. Allena Katz of Ortho is consulted.    Discharge Diagnoses:  Principal Problem:   Closed left hip fracture [facility]) Active Problems:   Fall at home, initial encounter   HTN (hypertension)   Diabetes mellitus without complication (HCC)   AKI (acute kidney injury) (HCC)    Leukocytosis   Memory loss  Closed left hip fracture: secondary to fall at home. S/p left hip hemiarthroplasty as per ortho surg 10/03/23. PT/OT recs SNF but pt's husband refused SNF. Ortho surg following and recs apprec  Normocytic anemia: w/ likely component of acute blood loss anemia secondary to recent surg. No need for a transfusion currently    Fall: at home. PT/OT recs SNF but pt's husband refused SNF    Leukocytosis: resolved   DM2: Recent A1c 6.5, well-controlled. Continue on SSI w/ accuchecks    AKI: Cr is labile. Avoid nephrotoxic meds    Memory loss: continue on home dose fo donepezil, seroquel. Needs to see neuro outpatient   Discharge Instructions  Discharge Instructions     Diet Carb Modified   Complete by: As directed    Discharge instructions   Complete by: As directed    F/u w/ PCP in 1-2 weeks. F/u w/ ortho surg, Dr. Allena Katz, in 2 weeks   Increase activity slowly   Complete by: As directed       Allergies as of 10/06/2023       Reactions   Prevagen [apoaequorin] Anaphylaxis   Bacitracin    Ciprofloxacin    Doxycycline    Januvia [sitagliptin]    Metformin And Related Diarrhea   Penicillins    Tetanus Toxoids Rash        Medication List     STOP taking these medications    lactase 3000 units tablet Commonly known as: LACTAID   meloxicam 15 MG tablet Commonly known as:  MOBIC   polyethylene glycol 17 g packet Commonly known as: MIRALAX / GLYCOLAX       TAKE these medications    acetaminophen 650 MG CR tablet Commonly known as: TYLENOL Take 650 mg by mouth 2 (two) times daily.   donepezil 10 MG disintegrating tablet Commonly known as: ARICEPT ODT Take 1 tablet (10 mg total) by mouth at bedtime.   enoxaparin 40 MG/0.4ML injection Commonly known as: LOVENOX Inject 0.4 mLs (40 mg total) into the skin daily for 26 days.   glipiZIDE 10 MG 24 hr tablet Commonly known as: GLUCOTROL XL Take 10 mg by mouth daily.   Janumet XR 50-1000  MG Tb24 Generic drug: SitaGLIPtin-MetFORMIN HCl Take 1 tablet by mouth 2 (two) times daily.   methocarbamol 500 MG tablet Commonly known as: ROBAXIN Take 1 tablet (500 mg total) by mouth every 8 (eight) hours as needed for muscle spasms.   multivitamin tablet Take 1 tablet by mouth daily.   oxyCODONE 5 MG immediate release tablet Commonly known as: Oxy IR/ROXICODONE Take 0.5-1 tablets (2.5-5 mg total) by mouth every 4 (four) hours as needed for moderate pain (pain score 4-6) (pain score 4-6).   QUEtiapine 25 MG tablet Commonly known as: SEROQUEL Take 25 mg by mouth at bedtime as needed (Sleep).               Durable Medical Equipment  (From admission, onward)           Start     Ordered   10/06/23 1313  For home use only DME Bedside commode  Once       Question:  Patient needs a bedside commode to treat with the following condition  Answer:  Impaired mobility   10/06/23 1312   10/06/23 1059  For home use only DME Bedside commode  Once       Question:  Patient needs a bedside commode to treat with the following condition  Answer:  Generalized weakness   10/06/23 1058            Follow-up Information     Dedra Skeens, PA-C. Go in 2 week(s).   Specialty: Orthopedic Surgery Contact information: [address] [place] [address] [phone]                Allergies  Allergen Reactions   Prevagen [Apoaequorin] Anaphylaxis   Bacitracin    Ciprofloxacin    Doxycycline    Januvia [Sitagliptin]    Metformin And Related Diarrhea   Penicillins    Tetanus Toxoids Rash    Consultations: Ortho surg    Procedures/Studies: DG Pelvis Portable Result Date: 10/03/2023 CLINICAL DATA:  Status post left hip hemiarthroplasty. EXAM: PORTABLE PELVIS 1-2 VIEWS COMPARISON:  AP view of the bilateral hips 07/08/2023, pelvis and left hip radiographs 10/02/2023 FINDINGS: Redemonstration of total right hip arthroplasty. New left  hip hemiarthroplasty. No perihardware lucency is seen to indicate hardware failure or loosening. Expected postoperative changes including subcutaneous airabout the left hip and lateral left hip surgical skin staples. IMPRESSION: Interval left hip hemiarthroplasty without evidence of hardware failure. Electronically Signed   By: Neita Garnet M.D.   On: 10/03/2023 14:34   DG HIP PORT UNILAT WITH PELVIS 1V LEFT Result Date: 10/03/2023 CLINICAL DATA:  Intraoperative radiograph of left hip hemiarthroplasty. EXAM: DG HIP (WITH OR WITHOUT PELVIS) 1V PORT LEFT COMPARISON:  Pelvis and left hip radiographs 10/02/2023 FINDINGS: There is diffuse decreased bone mineralization. Prior total right hip  arthroplasty. The patient is undergoing total left hip arthroplasty with placement of preliminary femoral stem and head. Postsurgical changes of left hip subcutaneous air. IMPRESSION: Intraoperative radiograph during total left hip arthroplasty. Electronically Signed   By: Neita Garnet M.D.   On: 10/03/2023 14:28   CT HEAD WO CONTRAST ( ) Result Date: 10/02/2023 CLINICAL DATA:  Status post fall. EXAM: CT HEAD WITHOUT CONTRAST TECHNIQUE: Contiguous axial images were obtained from the base of the skull through the vertex without intravenous contrast. RADIATION DOSE REDUCTION: This exam was performed according to the departmental dose-optimization program which includes automated exposure control, adjustment of the mA and/or kV according to patient size and/or use of iterative reconstruction technique. COMPARISON:  None Available. FINDINGS: Brain: There is generalized cerebral atrophy with widening of the extra-axial spaces and ventricular dilatation. There are areas of decreased attenuation within the white matter tracts of the supratentorial brain, consistent with microvascular disease changes. Vascular: Marked severity bilateral cavernous carotid artery calcification is noted. Skull: Normal. Negative for fracture or focal  lesion. Sinuses/Orbits: No acute finding. Other: None. IMPRESSION: 1. No acute intracranial abnormality. 2. Generalized cerebral atrophy and microvascular disease changes of the supratentorial brain. Electronically Signed   By: Aram Candela M.D.   On: 10/02/2023 22:33   DG Chest Portable 1 View Result Date: 10/02/2023 CLINICAL DATA:  Preoperative evaluation. EXAM: PORTABLE CHEST 1 VIEW COMPARISON:  November 14, 2021 FINDINGS: The heart size and mediastinal contours are within normal limits. There is no evidence of an acute infiltrate, pleural effusion or pneumothorax. Radiopaque surgical clips are seen along the right axilla. Multilevel degenerative changes are noted throughout the thoracic spine. IMPRESSION: No active cardiopulmonary disease. Electronically Signed   By: Aram Candela M.D.   On: 10/02/2023 22:31   DG Hip Unilat W or Wo Pelvis 2-3 Views Left Result Date: 10/02/2023 CLINICAL DATA:  Recent fall with left hip pain, initial encounter EXAM: DG HIP (WITH OR WITHOUT PELVIS) 3V LEFT COMPARISON:  None Available. FINDINGS: Subcapital left femoral neck fracture is noted with impaction and angulation at the fracture site. Pelvic ring is intact. Prior right hip replacement is noted. IMPRESSION: Subcapital left femoral neck fracture. Electronically Signed   By: Alcide Clever M.D.   On: 10/02/2023 21:38   (Echo, Carotid, EGD, Colonoscopy, ERCP)    Subjective: pt c/o fatigue    Discharge Exam: Vitals:   10/06/23 0525 10/06/23 0835  BP: (!) 142/68 138/76  Pulse: 74 67  Resp:  16  Temp: 97.8 F (36.6 C) 97.6 F (36.4 C)  SpO2: 96% 99%   Vitals:   10/05/23 1342 10/05/23 2044 10/06/23 0525 10/06/23 0835  BP: 123/83 (!) 143/68 (!) 142/68 138/76  Pulse: 73 76 74 67  Resp: 16   16  Temp: 97.6 F (36.4 C) 98.5 F (36.9 C) 97.8 F (36.6 C) 97.6 F (36.4 C)  TempSrc:  Oral Oral Oral  SpO2: 100% 100% 96% 99%  Weight:        General: Pt is alert, awake, not in acute  distress Cardiovascular: S1/S2 +, no rubs, no gallops Respiratory: CTA bilaterally, no wheezing, no rhonchi Abdominal: Soft, NT, ND, bowel sounds + Extremities: no edema, no cyanosis    The results of significant diagnostics from this hospitalization (including imaging, microbiology, ancillary and laboratory) are listed below for reference.     Microbiology: No results found for this or any previous visit (from the past 240 hours).   Labs: BNP (last 3 results) No results for input(s): "BNP"  in the last 8760 hours. Basic Metabolic Panel: Recent Labs  Lab 10/02/23 1755 10/03/23 0429 10/04/23 0355 10/05/23 0600 10/06/23 0458  NA 137 138 133* 134* 137  K 4.1 4.2 3.5 3.6 3.6  CL 100 104 100 103 105  CO2 24 25 22 23 24   GLUCOSE 248* 194* 224* 174* 196*  BUN 22 18 19 19  26*  CREATININE 1.07* 0.91 1.12* 0.99 0.98  CALCIUM 9.2 8.8* 7.8* 8.1* 8.2*   Liver Function Tests: Recent Labs  Lab 10/02/23 1755  AST 32  ALT 22  ALKPHOS 62  BILITOT 0.7  PROT 7.3  ALBUMIN 4.0   No results for input(s): "LIPASE", "AMYLASE" in the last 168 hours. No results for input(s): "AMMONIA" in the last 168 hours. CBC: Recent Labs  Lab 10/02/23 1755 10/03/23 0429 10/04/23 0355 10/05/23 0600 10/06/23 0458  WBC 13.4* 11.6* 10.1 9.8 10.0  HGB 12.1 11.1* 9.1* 9.1* 8.6*  HCT 36.7 33.6* 26.9* 27.1* 25.9*  MCV 89.1 87.3 87.6 86.6 86.0  PLT 380 323 222 226 257   Cardiac Enzymes: No results for input(s): "CKTOTAL", "CKMB", "CKMBINDEX", "TROPONINI" in the last 168 hours. BNP: Invalid input(s): "POCBNP" CBG: Recent Labs  Lab 10/05/23 1140 10/05/23 1656 10/05/23 2025 10/06/23 1100 10/06/23 1218  GLUCAP 222* 197* 219* 337* 354*   D-Dimer No results for input(s): "DDIMER" in the last 72 hours. Hgb A1c No results for input(s): "HGBA1C" in the last 72 hours. Lipid Profile No results for input(s): "CHOL", "HDL", "LDLCALC", "TRIG", "CHOLHDL", "LDLDIRECT" in the last 72 hours. Thyroid  function studies No results for input(s): "TSH", "T4TOTAL", "T3FREE", "THYROIDAB" in the last 72 hours.  Invalid input(s): "FREET3" Anemia work up No results for input(s): "VITAMINB12", "FOLATE", "FERRITIN", "TIBC", "IRON", "RETICCTPCT" in the last 72 hours. Urinalysis No results found for: "COLORURINE", "APPEARANCEUR", "LABSPEC", "PHURINE", "GLUCOSEU", "HGBUR", "BILIRUBINUR", "KETONESUR", "PROTEINUR", "UROBILINOGEN", "NITRITE", "LEUKOCYTESUR" Sepsis Labs Recent Labs  Lab 10/03/23 0429 10/04/23 0355 10/05/23 0600 10/06/23 0458  WBC 11.6* 10.1 9.8 10.0   Microbiology No results found for this or any previous visit (from the past 240 hours).   Time coordinating discharge: Over 30 minutes  SIGNED:   Charise Killian, MD  [facility] 10/06/2023, 1:19 PM Pager   If 7PM-7AM, please contact night-coverage [email or web address]

## 2023-10-06 NOTE — Progress Notes (Signed)
 Occupational Therapy Treatment Patient Details Name: Sherri Garza MRN: [id number] DOB: 01/30/1941 Today's Date: 10/06/2023   History of present illness Sherri Garza is a 83 y.o. female who sustained a displaced femoral neck fracture after a fall.  Pt received a L hip hemiarthroplasty on 10/04/23.  Pt has PMH including:  medical history significant of DM, HTN, memory loss, depression, IBS, breast cancer, Bell's palsy, leukocytosis.   OT comments  Pt seen for OT treatment on this date. Upon arrival to room pt sitting up in bed with her husband at bedside. Pt requires frequent verbal cues to remind her why her L hip is sore and why she is here. Pt often forgets what task she is completing and needs verbal cues to recognize task completion. Pt likes to do things herself but unaware of deficits.Pt step pivot to [facility] with [facility] for hips and DME management, pt completed pericare with [facility] for stability only. Pt amb 200' in hallway (chair follow) with RW and min a x 1 for cues to step into walker and general safety.  Pt making good progress toward goals, will continue to follow POC. Discharge recommendations updated.         If plan is discharge home, recommend the following:  A little help with walking and/or transfers;A little help with bathing/dressing/bathroom;Assistance with cooking/housework;Direct supervision/assist for medications management;Direct supervision/assist for financial management;Assist for transportation;Help with stairs or ramp for entrance   Equipment Recommendations  None recommended by OT    Recommendations for Other Services      Precautions / Restrictions Precautions Precautions: Posterior Hip Recall of Precautions/Restrictions: Impaired Precaution/Restrictions Comments: Posterior hip precautions, however pt unable to verbalize or recall. Required Braces or Orthoses: Knee Immobilizer - Left (To prevent pt from breaking hip precautions) Restrictions Weight Bearing  Restrictions Per Provider Order: Yes LLE Weight Bearing Per Provider Order: Weight bearing as tolerated       Mobility Bed Mobility Overal bed mobility: Needs Assistance Bed Mobility: Supine to Sit     Supine to sit: Min assist     General bed mobility comments: assist for LE management    Transfers Overall transfer level: Needs assistance Equipment used: Rolling walker (2 wheels) Transfers: Sit to/from Stand Sit to Stand: Min assist Stand pivot transfers: Min assist               Balance Overall balance assessment: Needs assistance Sitting-balance support: Bilateral upper extremity supported, Feet supported Sitting balance-Leahy Scale: Fair     Standing balance support: Bilateral upper extremity supported, Reliant on assistive device for balance Standing balance-Leahy Scale: Fair Standing balance comment: Pt able to attend to self care needs at [facility], verbal cues for initation and sequencing at times                           ADL either performed or assessed with clinical judgement   ADL Overall ADL's : Needs assistance/impaired Eating/Feeding: Modified independent;Sitting   Grooming: Wash/dry face;Wash/dry hands;Oral care;Brushing hair;Sitting;Modified independent                   Toilet Transfer: Ambulation;BSC/3in1;Rolling walker (2 wheels);Minimal assistance   Toileting- Clothing Manipulation and Hygiene: Contact guard assist (CGA for stability while pt completed pericare. Cues to sequencing and safety)       Functional mobility during ADLs: Minimal assistance;Rolling walker (2 wheels);Cueing for safety General ADL Comments: Pt completed toileting on [facility], with physical assist for stability while pt completed  pericare. Completed oral care, face washing and hair brushing seated in the recliner; MODI no physical assistance required, extra time only     Communication Communication Communication: No apparent difficulties   Cognition  Arousal: Alert Behavior During Therapy: WFL for tasks assessed/performed Cognition: History of cognitive impairments, Cognition impaired   Orientation impairments: Situation, Time, Place Awareness: Intellectual awareness impaired Memory impairment (select all impairments): Short-term memory, Working Biochemist, clinical functioning impairment (select all impairments): Initiation, Organization OT - Cognition Comments: Pt requires frequent verbal cues to remind her why her L hip is sore and why she is here. Pt often forgets what task she is completing. Pt likes to do things herself but unaware of deficits.                 Following commands: Impaired Following commands impaired: Follows one step commands with increased time      Cueing   Cueing Techniques: Verbal cues, Tactile cues  Exercises Exercises: Other exercises Other Exercises Other Exercises: Edu: safe DME use, fall prevention at d/c    Shoulder Instructions       General Comments Post op site d/c/I    Pertinent Vitals/ Pain       Pain Assessment Pain Assessment: PAINAD Faces Pain Scale: Hurts a little bit Breathing: noisy labored breathing, long periods of hyperventilation, Cheyne-Stokes respirations Negative Vocalization: repeated troubled calling out, loud moaning/groaning, crying Facial Expression: facial grimacing Body Language: rigid, fists clenched, knees up, pushing/pulling away, strikes out Consolability: unable to console, distract or reassure PAINAD Score: 10 Pain Location: back, L hip Pain Intervention(s): Limited activity within patient's tolerance, Monitored during session, Repositioned  Home Living                                          Prior Functioning/Environment              Frequency  Min 2X/week        Progress Toward Goals  OT Goals(current goals can now be found in the care plan section)  Progress towards OT goals: Progressing toward goals  Acute  Rehab OT Goals Patient Stated Goal: return home to cats Time For Goal Achievement: 10/18/23 Potential to Achieve Goals: Good  Plan      Co-evaluation    PT/OT/SLP Co-Evaluation/Treatment: Yes Reason for Co-Treatment: Complexity of the patient's impairments (multi-system involvement) PT goals addressed during session: Mobility/safety with mobility;Proper use of DME OT goals addressed during session: ADL's and self-care      AM-PAC OT "6 Clicks" Daily Activity     Outcome Measure   Help from another person eating meals?: A Little Help from another person taking care of personal grooming?: A Little Help from another person toileting, which includes using toliet, bedpan, or urinal?: A Lot Help from another person bathing (including washing, rinsing, drying)?: A Little Help from another person to put on and taking off regular upper body clothing?: A Little Help from another person to put on and taking off regular lower body clothing?: A Little 6 Click Score: 17    End of Session Equipment Utilized During Treatment: Rolling walker (2 wheels)  OT Visit Diagnosis: Unsteadiness on feet (R26.81);Other abnormalities of gait and mobility (R26.89);Muscle weakness (generalized) (M62.81);Pain;Other symptoms and signs involving cognitive function Pain - Right/Left: Left Pain - part of body: Hip   Activity Tolerance Patient tolerated treatment well   Patient  Left with call bell/phone within reach;in chair;with chair alarm set;with family/visitor present   Nurse Communication Mobility status        Time: 1610-9604 OT Time Calculation (min): 36 min  Charges: OT General Charges $OT Visit: 1 Visit OT Treatments $Self Care/Home Management : 8-22 mins  Glenard Haring M.S. OTR/L  10/06/23, 11:24 AM

## 2023-10-06 NOTE — Care Management Important Message (Signed)
 Important Message  Patient Details  Name: Sherri Garza MRN: [id number] Date of Birth: Jul 05, 1941   Important Message Given:  Yes - Medicare IM     Cristela Blue, CMA 10/06/2023, 12:39 PM

## 2023-10-06 NOTE — Progress Notes (Signed)
 Patient is not able to walk the distance required to go the bathroom, or he/she is unable to safely negotiate stairs required to access the bathroom.  A 3in1 BSC will alleviate this problem

## 2023-10-06 NOTE — Progress Notes (Signed)
   Subjective: 3 Days Post-Op Procedure(s) (LRB): HEMIARTHROPLASTY (BIPOLAR) HIP, POSTERIOR APPROACH FOR FRACTURE (Left) Patient reports pain as mild.   Patient is well, and has had no acute complaints or problems Denies any CP, SOB, ABD pain. Continue with physical therapy today.    Objective: Vital signs in last 24 hours: Temp:  [97.6 F (36.4 C)-98.5 F (36.9 C)] 97.8 F (36.6 C) (03/10 0525) Pulse Rate:  [71-76] 74 (03/10 0525) Resp:  [16] 16 (03/09 1342) BP: (116-143)/(64-83) 142/68 (03/10 0525) SpO2:  [96 %-100 %] 96 % (03/10 0525)  Intake/Output from previous day: 03/09 0701 - 03/10 0700 In: 220 [P.O.:220] Out: -  Intake/Output this shift: No intake/output data recorded.  Recent Labs    10/04/23 0355 10/05/23 0600 10/06/23 0458  HGB 9.1* 9.1* 8.6*   Recent Labs    10/05/23 0600 10/06/23 0458  WBC 9.8 10.0  RBC 3.13* 3.01*  HCT 27.1* 25.9*  PLT 226 257   Recent Labs    10/05/23 0600 10/06/23 0458  NA 134* 137  K 3.6 3.6  CL 103 105  CO2 23 24  BUN 19 26*  CREATININE 0.99 0.98  GLUCOSE 174* 196*  CALCIUM 8.1* 8.2*   No results for input(s): "LABPT", "INR" in the last 72 hours.   EXAM General - Patient is Alert, Appropriate, and Confused Extremity - Neurovascular intact Sensation intact distally Intact pulses distally Dorsiflexion/Plantar flexion intact Negative Homans' sign bilaterally Dressing - dressing C/D/I.  Knee immobilizer intact to the knee. Motor Function - intact, moving foot and toes well on exam.  Ambulated to the chair.  Past Medical History:  Diagnosis Date   Arthritis    Bell's palsy    2007 and 2019 brought on by stress   Breast cancer (HCC)    Depression    Diabetes mellitus without complication (HCC)    Glaucoma    HTN (hypertension)    IBS (irritable bowel syndrome)    Memory loss    short term   Vitamin D deficiency     Assessment/Plan:   3 Days Post-Op Procedure(s) (LRB): HEMIARTHROPLASTY (BIPOLAR) HIP,  POSTERIOR APPROACH FOR FRACTURE (Left) Principal Problem:   Closed left hip fracture (HCC) Active Problems:   Fall at home, initial encounter   Diabetes mellitus without complication (HCC)   Memory loss   AKI (acute kidney injury) (HCC)   Leukocytosis   HTN (hypertension)  Estimated body mass index is 24.69 kg/m as calculated from the following:   Height as of 07/08/23: 5\' 2"  (1.575 m).   Weight as of this encounter: 61.2 kg. Advance diet Up with therapy.  Slow progress with physical therapy yesterday. Pain well-controlled Vital signs are stable Labs are stable, hemoglobin 8.6.  Continue to monitor Care management to assist with discharge to skilled nursing facility    Lovenox 40mg /day x 4 weeks to start on POD#1 Posterior hip precautions.  Follow-up with [facility] orthopedics in 2 weeks for staple removal and x-rays of the left hip    DVT Prophylaxis - Lovenox, TED hose, and SCDs Weight-Bearing as tolerated to left leg   Dedra Skeens PA-C [facility] 10/06/2023, 7:19 AM

## 2023-10-06 NOTE — TOC Progression Note (Signed)
 Transition of Care [facility]) - Progression Note    Patient Details  Name: Sherri Garza MRN: [id number] Date of Birth: 1941-06-21  Transition of Care [facility]) CM/SW Contact  Marlowe Sax, RN Phone Number: 10/06/2023, 12:27 PM  Clinical Narrative:     Went in to see the patient, she is confused, I called Husband Peyton Najjar and left a VM  He called me back, He stated that the patient wants to go home with [facility] She used [facility] in the past and wants to again, she has a rolling walker and and a rollator they need a 3 in1 [employer] to deliver to the room   Expected Discharge Plan: Skilled Nursing Facility Barriers to Discharge: Continued Medical Work up  Expected Discharge Plan and Services   Discharge Planning Services: CM Consult   Living arrangements for the past 2 months: Independent Living Facility                                       Social Determinants of Health (SDOH) Interventions SDOH Screenings   Food Insecurity: No Food Insecurity (10/02/2023)  Housing: Low Risk  (10/02/2023)  Transportation Needs: No Transportation Needs (10/02/2023)  Utilities: Not At Risk (10/02/2023)  Alcohol Screen: Low Risk  (03/18/2023)  Depression (PHQ2-9): Low Risk  (03/18/2023)  Social Connections: Socially Integrated (10/02/2023)  Tobacco Use: Low Risk  (10/03/2023)    Readmission Risk Interventions     No data to display

## 2023-10-15 DIAGNOSIS — M797 Fibromyalgia: Secondary | ICD-10-CM | POA: Diagnosis not present

## 2023-10-15 DIAGNOSIS — S72012D Unspecified intracapsular fracture of left femur, subsequent encounter for closed fracture with routine healing: Secondary | ICD-10-CM | POA: Diagnosis not present

## 2023-10-15 DIAGNOSIS — N179 Acute kidney failure, unspecified: Secondary | ICD-10-CM | POA: Diagnosis not present

## 2023-10-15 DIAGNOSIS — H409 Unspecified glaucoma: Secondary | ICD-10-CM | POA: Diagnosis not present

## 2023-10-15 DIAGNOSIS — K581 Irritable bowel syndrome with constipation: Secondary | ICD-10-CM | POA: Diagnosis not present

## 2023-10-15 DIAGNOSIS — I10 Essential (primary) hypertension: Secondary | ICD-10-CM | POA: Diagnosis not present

## 2023-10-15 DIAGNOSIS — I351 Nonrheumatic aortic (valve) insufficiency: Secondary | ICD-10-CM | POA: Diagnosis not present

## 2023-10-15 DIAGNOSIS — Z96642 Presence of left artificial hip joint: Secondary | ICD-10-CM | POA: Diagnosis not present

## 2023-10-15 DIAGNOSIS — I2510 Atherosclerotic heart disease of native coronary artery without angina pectoris: Secondary | ICD-10-CM | POA: Diagnosis not present

## 2023-10-15 DIAGNOSIS — F0393 Unspecified dementia, unspecified severity, with mood disturbance: Secondary | ICD-10-CM | POA: Diagnosis not present

## 2023-10-15 DIAGNOSIS — F32A Depression, unspecified: Secondary | ICD-10-CM | POA: Diagnosis not present

## 2023-10-15 DIAGNOSIS — Z86711 Personal history of pulmonary embolism: Secondary | ICD-10-CM | POA: Diagnosis not present

## 2023-10-15 DIAGNOSIS — D509 Iron deficiency anemia, unspecified: Secondary | ICD-10-CM | POA: Diagnosis not present

## 2023-10-15 DIAGNOSIS — E7800 Pure hypercholesterolemia, unspecified: Secondary | ICD-10-CM | POA: Diagnosis not present

## 2023-10-15 DIAGNOSIS — D630 Anemia in neoplastic disease: Secondary | ICD-10-CM | POA: Diagnosis not present

## 2023-10-15 DIAGNOSIS — N6320 Unspecified lump in the left breast, unspecified quadrant: Secondary | ICD-10-CM | POA: Diagnosis not present

## 2023-10-15 DIAGNOSIS — R42 Dizziness and giddiness: Secondary | ICD-10-CM | POA: Diagnosis not present

## 2023-10-15 DIAGNOSIS — F0392 Unspecified dementia, unspecified severity, with psychotic disturbance: Secondary | ICD-10-CM | POA: Diagnosis not present

## 2023-10-15 DIAGNOSIS — H905 Unspecified sensorineural hearing loss: Secondary | ICD-10-CM | POA: Diagnosis not present

## 2023-10-15 DIAGNOSIS — R053 Chronic cough: Secondary | ICD-10-CM | POA: Diagnosis not present

## 2023-10-15 DIAGNOSIS — K5730 Diverticulosis of large intestine without perforation or abscess without bleeding: Secondary | ICD-10-CM | POA: Diagnosis not present

## 2023-10-15 DIAGNOSIS — E079 Disorder of thyroid, unspecified: Secondary | ICD-10-CM | POA: Diagnosis not present

## 2023-10-15 DIAGNOSIS — E119 Type 2 diabetes mellitus without complications: Secondary | ICD-10-CM | POA: Diagnosis not present

## 2023-10-15 DIAGNOSIS — G510 Bell's palsy: Secondary | ICD-10-CM | POA: Diagnosis not present

## 2023-10-15 DIAGNOSIS — C50919 Malignant neoplasm of unspecified site of unspecified female breast: Secondary | ICD-10-CM | POA: Diagnosis not present

## 2023-10-17 DIAGNOSIS — S22080D Wedge compression fracture of T11-T12 vertebra, subsequent encounter for fracture with routine healing: Secondary | ICD-10-CM | POA: Diagnosis not present

## 2023-10-17 DIAGNOSIS — R413 Other amnesia: Secondary | ICD-10-CM | POA: Diagnosis not present

## 2023-10-17 DIAGNOSIS — D649 Anemia, unspecified: Secondary | ICD-10-CM | POA: Diagnosis not present

## 2023-10-17 DIAGNOSIS — I10 Essential (primary) hypertension: Secondary | ICD-10-CM | POA: Diagnosis not present

## 2023-10-17 DIAGNOSIS — Z8781 Personal history of (healed) traumatic fracture: Secondary | ICD-10-CM | POA: Diagnosis not present

## 2023-10-17 DIAGNOSIS — F22 Delusional disorders: Secondary | ICD-10-CM | POA: Diagnosis not present

## 2023-10-20 DIAGNOSIS — M25552 Pain in left hip: Secondary | ICD-10-CM | POA: Diagnosis not present

## 2023-10-23 DIAGNOSIS — D473 Essential (hemorrhagic) thrombocythemia: Secondary | ICD-10-CM | POA: Diagnosis not present

## 2023-10-24 DIAGNOSIS — S72012D Unspecified intracapsular fracture of left femur, subsequent encounter for closed fracture with routine healing: Secondary | ICD-10-CM | POA: Diagnosis not present

## 2023-10-24 DIAGNOSIS — N179 Acute kidney failure, unspecified: Secondary | ICD-10-CM | POA: Diagnosis not present

## 2023-10-24 DIAGNOSIS — Z96642 Presence of left artificial hip joint: Secondary | ICD-10-CM | POA: Diagnosis not present

## 2023-10-24 DIAGNOSIS — E119 Type 2 diabetes mellitus without complications: Secondary | ICD-10-CM | POA: Diagnosis not present

## 2023-10-24 DIAGNOSIS — C50919 Malignant neoplasm of unspecified site of unspecified female breast: Secondary | ICD-10-CM | POA: Diagnosis not present

## 2023-10-24 DIAGNOSIS — D630 Anemia in neoplastic disease: Secondary | ICD-10-CM | POA: Diagnosis not present

## 2023-10-29 DIAGNOSIS — S72012D Unspecified intracapsular fracture of left femur, subsequent encounter for closed fracture with routine healing: Secondary | ICD-10-CM | POA: Diagnosis not present

## 2023-10-29 DIAGNOSIS — D630 Anemia in neoplastic disease: Secondary | ICD-10-CM | POA: Diagnosis not present

## 2023-10-29 DIAGNOSIS — N179 Acute kidney failure, unspecified: Secondary | ICD-10-CM | POA: Diagnosis not present

## 2023-10-29 DIAGNOSIS — E119 Type 2 diabetes mellitus without complications: Secondary | ICD-10-CM | POA: Diagnosis not present

## 2023-10-29 DIAGNOSIS — Z96642 Presence of left artificial hip joint: Secondary | ICD-10-CM | POA: Diagnosis not present

## 2023-10-29 DIAGNOSIS — C50919 Malignant neoplasm of unspecified site of unspecified female breast: Secondary | ICD-10-CM | POA: Diagnosis not present

## 2023-11-03 NOTE — Patient Instructions (Signed)
Below is our plan:  We will continue donepezil 10mg and memantine 10mg daily  Please make sure you are staying well hydrated. I recommend 50-60 ounces daily. Well balanced diet and regular exercise encouraged. Consistent sleep schedule with 6-8 hours recommended.   Please continue follow up with care team as directed.   Follow up with me in 1 year   You may receive a survey regarding today's visit. I encourage you to leave honest feed back as I do use this information to improve patient care. Thank you for seeing me today!   Management of Memory Problems   There are some general things you can do to help manage your memory problems.  Your memory may not in fact recover, but by using techniques and strategies you will be able to manage your memory difficulties better.   1)  Establish a routine. Try to establish and then stick to a regular routine.  By doing this, you will get used to what to expect and you will reduce the need to rely on your memory.  Also, try to do things at the same time of day, such as taking your medication or checking your calendar first thing in the morning. Think about think that you can do as a part of a regular routine and make a list.  Then enter them into a daily planner to remind you.  This will help you establish a routine.   2)  Organize your environment. Organize your environment so that it is uncluttered.  Decrease visual stimulation.  Place everyday items such as keys or cell phone in the same place every day (ie.  Basket next to front door) Use post it notes with a brief message to yourself (ie. Turn off light, lock the door) Use labels to indicate where things go (ie. Which cupboards are for food, dishes, etc.) Keep a notepad and pen by the telephone to take messages   3)  Memory Aids A diary or journal/notebook/daily planner Making a list (shopping list, chore list, to do list that needs to be done) Using an alarm as a reminder (kitchen timer or cell  phone alarm) Using cell phone to store information (Notes, Calendar, Reminders) Calendar/White board placed in a prominent position Post-it notes   In order for memory aids to be useful, you need to have good habits.  It's no good remembering to make a note in your journal if you don't remember to look in it.  Try setting aside a certain time of day to look in journal.   4)  Improving mood and managing fatigue. There may be other factors that contribute to memory difficulties.  Factors, such as anxiety, depression and tiredness can affect memory. Regular gentle exercise can help improve your mood and give you more energy. Exercise: there are short videos created by the National Institute on Health specially for older adults: https://bit.ly/2I30q97.  Mediterranean diet: which emphasizes fruits, vegetables, whole grains, legumes, fish, and other seafood; unsaturated fats such as olive oils; and low amounts of red meat, eggs, and sweets. A variation of this, called MIND (Mediterranean-DASH Intervention for Neurodegenerative Delay) incorporates the DASH (Dietary Approaches to Stop Hypertension) diet, which has been shown to lower high blood pressure, a risk factor for Alzheimer's disease. More information at: https://www.nia.nih.gov/health/what-do-we-know-about-diet-and-prevention-alzheimers-disease.  Aerobic exercise that improve heart health is also good for the mind.  National Institute on Aging have short videos for exercises that you can do at home: www.nia.nih.gov/Go4Life Simple relaxation techniques may help relieve   symptoms of anxiety Try to get back to completing activities or hobbies you enjoyed doing in the past. Learn to pace yourself through activities to decrease fatigue. Find out about some local support groups where you can share experiences with others. Try and achieve 7-8 hours of sleep at night.   Resources for Family/Caregiver  Online caregiver support groups can be found at  alz.org or call Alzheimer's Association's 24/7 hotline: 800.272.3900. [facility] offers in-person, virtual support groups and individual counseling for both care partners and persons with memory loss. Call for more information at [phone].   Advanced care plan: there are two types of Power of Attorney: healthcare and durable. Healthcare POA is a designated person to make healthcare decisions on your behalf if you were too sick to make them yourself. This person can be selected and documented by your physician. Durable POA has to be set up with a lawyer who takes charge of your finances and estate if you were too sick or cognitively impaired to manage your finances accurately. You can find a local Elder Law lawyer here: https://www.naela.org/.  Check out www.planyourlifespan.org, which will help you plan before a crisis and decide who will take care of life considerations in a circumstance where you may not be able to speak for yourself.   Helpful books (available on Amazon or your local bookstore):  By Dr. Ed Shaw: Keeping Love Alive as Memories Fade: The 5 Love Languages and the Alzheimer's Journey Apr 29, 2015 The Dementia Care Partner's Workbook: A Guide for Understanding, Education, and Hope Paperback - December 27, 2017.  Both available for less than $15.   "Coping with behavior change in dementia: a family caregiver's guide" by Beth Spencer & Laurie White "A Caregiver's Guide to Dementia: Using Activities and Other Strategies to Prevent, Reduce and Manage Behavioral Symptoms" by Laura N. Gitlin and Catherine Piersol.  "Creating Moments of Joy for the Person with Alzheimer's or Dementia" 4th edition by Jolene Brackrey  Caregiver videos on common behaviors related to dementia: https://www.[facility].org/dementia/caregiver-education-videos  [facility] Caregiver Portal: free to sign up, links to local resources: [email or web address]/login  

## 2023-11-03 NOTE — Progress Notes (Unsigned)
 No chief complaint on file.   HISTORY OF PRESENT ILLNESS:  11/03/23 ALL:  Sherri Garza returns for follow up for memory loss. She was last seen 12/2022 and noted to have worsening memory loss and more agitation. We continued donepezil 10mg  daily and added memantine. She had a fall 10/02/2023 and broke her left hip. She underwent partial hip replacement at [facility]. Right hip replaced after fall in 06/2023.   Memantine? Quetiapine?  01/15/2023 ALL:  Sherri Garza is a 83 y.o. female here today for follow up for memory loss. She was seen in consult with Dr Delena Bali 06/2022. MRI recommended, however, patient refused. She was started on donepezil and quetiapine continued. Sherri Garza called 01/13/23 inquiring about need for appt. He reported she refused to continue quetiapine but has continued donepezil.   Since, she reports doing well. Sherri Garza presents with her and aides in history. He reports she seems to be having more difficulty with short term memory loss. She gets agitated, easily. He has noted that her right hand shakes sometimes when she is trying to eat or if she is upset. She does not have children or any family close by. No significant relationships with neighbors or community members. She does walk around her neighborhood daily. No falls. She sleeps well. Usually 10-11p-8-9a. She is eating normally. She cooks and performs ADLs. She does not drive. Sherri Garza manages meds.    HISTORY (copied from Dr Quentin Mulling previous note)  The patient presents for evaluation of memory loss which has been present over the past 4 years. Sherri Garza states this started after a dental X-ray. For 2 years after this she would wake up in the middle of the night screaming that the dentist was trying to kill her. She would be inconsolable for 1-2 hours each night. This did improve over time but her memory continued to decline, especially over the past year. She has started forgetting names and dates. She cannot remember her cats'  names.  She has to write multiple notes to remember things and is misplacing objects all over the house. Forgets conversations the day after she has them. She does continue to get more confused at night, so she was prescribed aripiprazole by her PCP. This helped with the confusion but she continued to wander around the house at night. She was then prescribed Seroquel, which has been more effective and helped her sleep through the night. She is in denial about her memory loss, stating she chooses to forget things because she does not want to remember bad memories.   She has also developed daily severe right-sided headaches over the past couple of years. She was prescribed indomethacin for headaches, but this was discontinued as it was reportedly affecting her blood counts.    TBI:  No past history of TBI Stroke:  no past history of stroke Seizures:  no past history of seizures Sleep: She sleeps well at bedtime. She does have increased confusion in the evening, which has improved with Seroquel Mood: She has become socially isolated since the pandemic. Sherri Garza notes she has been more irritable and will say angry things, then will act as if they never fought.   Functional status: Patient lives with her Sherri Garza Cooking: cooks without issues Shopping: Sherri Garza does the shopping Driving: She doesn't drive Bills: Sherri Garza manages finances,  Medications: Sherri Garza helps manage her medications because her blood sugar was getting out of control Ever left the stove on by accident?: no Forget how to use items  around the house?: no Getting lost going to familiar places?: no Forgetting loved ones names?: yes Word finding difficulty? none   OTHER MEDICAL CONDITIONS: DM2, glaucoma, vitamin D deficiency   REVIEW OF SYSTEMS: Out of a complete 14 system review of symptoms, the patient complains only of the following symptoms, agitation and all other reviewed systems are negative.   ALLERGIES: Allergies   Allergen Reactions   Prevagen [Apoaequorin] Anaphylaxis   Bacitracin    Ciprofloxacin    Doxycycline    Januvia [Sitagliptin]    Metformin And Related Diarrhea   Penicillins    Tetanus Toxoids Rash     HOME MEDICATIONS: Outpatient Medications Prior to Visit  Medication Sig Dispense Refill   acetaminophen (TYLENOL) 650 MG CR tablet Take 650 mg by mouth 2 (two) times daily.     donepezil (ARICEPT ODT) 10 MG disintegrating tablet Take 1 tablet (10 mg total) by mouth at bedtime. 30 tablet 2   enoxaparin (LOVENOX) 40 MG/0.4ML injection Inject 0.4 mLs (40 mg total) into the skin daily for 26 days. 10.4 mL 0   glipiZIDE (GLUCOTROL XL) 10 MG 24 hr tablet Take 10 mg by mouth daily.     methocarbamol (ROBAXIN) 500 MG tablet Take 1 tablet (500 mg total) by mouth every 8 (eight) hours as needed for muscle spasms. 40 tablet 0   Multiple Vitamin (MULTIVITAMIN) tablet Take 1 tablet by mouth daily.     oxyCODONE (OXY IR/ROXICODONE) 5 MG immediate release tablet Take 0.5-1 tablets (2.5-5 mg total) by mouth every 4 (four) hours as needed for moderate pain (pain score 4-6) (pain score 4-6). 30 tablet 0   QUEtiapine (SEROQUEL) 25 MG tablet Take 25 mg by mouth at bedtime as needed (Sleep).     SitaGLIPtin-MetFORMIN HCl (JANUMET XR) 50-1000 MG TB24 Take 1 tablet by mouth 2 (two) times daily.     No facility-administered medications prior to visit.     PAST MEDICAL HISTORY: Past Medical History:  Diagnosis Date   Arthritis    Bell's palsy    2007 and 2019 brought on by stress   Breast cancer (HCC)    Depression    Diabetes mellitus without complication (HCC)    Glaucoma    HTN (hypertension)    IBS (irritable bowel syndrome)    Memory loss    short term   Vitamin D deficiency      PAST SURGICAL HISTORY: Past Surgical History:  Procedure Laterality Date   APPENDECTOMY     HIP ARTHROPLASTY Left 10/03/2023   Procedure: HEMIARTHROPLASTY (BIPOLAR) HIP, POSTERIOR APPROACH FOR FRACTURE;   Surgeon: Signa Kell, MD;  Location: [facility] ORS;  Service: Orthopedics;  Laterality: Left;   LAPAROSCOPIC OVARIAN CYSTECTOMY     MASTECTOMY Right 1999   TONSILLECTOMY     TOTAL HIP ARTHROPLASTY Right 07/08/2023   Procedure: TOTAL HIP ARTHROPLASTY ANTERIOR APPROACH;  Surgeon: Durene Romans, MD;  Location: [facility] ORS;  Service: Orthopedics;  Laterality: Right;     FAMILY HISTORY: Family History  Problem Relation Age of Onset   Cancer Mother    Cancer Father    Prostate cancer Father    Cancer Brother    Lung cancer Brother      SOCIAL HISTORY: Social History   Socioeconomic History   Marital status: Married    Spouse name: Not on file   Number of children: Not on file   Years of education: Not on file   Highest education level: Not on file  Occupational History  Not on file  Tobacco Use   Smoking status: Never   Smokeless tobacco: Never  Vaping Use   Vaping status: Never Used  Substance and Sexual Activity   Alcohol use: Not Currently   Drug use: Never   Sexual activity: Not Currently  Other Topics Concern   Not on file  Social History Narrative   ** Merged History Encounter **       Social Drivers of Health   Financial Resource Strain: Not on file  Food Insecurity: No Food Insecurity (10/02/2023)   Hunger Vital Sign    Worried About Running Out of Food in the Last Year: Never true    Ran Out of Food in the Last Year: Never true  Transportation Needs: No Transportation Needs (10/02/2023)   PRAPARE - Administrator, Civil Service (Medical): No    Lack of Transportation (Non-Medical): No  Physical Activity: Not on file  Stress: Not on file  Social Connections: Socially Integrated (10/02/2023)   Social Connection and Isolation Panel [NHANES]    Frequency of Communication with Friends and Family: Three times a week    Frequency of Social Gatherings with Friends and Family: Three times a week    Attends Religious Services: 1 to 4 times per year    Active  Member of Clubs or Organizations: Yes    Attends Banker Meetings: 1 to 4 times per year    Marital Status: Married  Catering manager Violence: Not At Risk (10/02/2023)   Humiliation, Afraid, Rape, and Kick questionnaire    Fear of Current or Ex-Partner: No    Emotionally Abused: No    Physically Abused: No    Sexually Abused: No     PHYSICAL EXAM  There were no vitals filed for this visit.  There is no height or weight on file to calculate BMI.  Generalized: Well developed, in no acute distress  Cardiology: normal rate and rhythm, no murmur auscultated  Respiratory: clear to auscultation bilaterally    Neurological examination  Mentation: Alert oriented to time, place, and some history taking. Follows all commands speech and language fluent. Easily agitated and repetitive in questioning.  Cranial nerve II-XII: Pupils were equal round reactive to light. Extraocular movements were full, visual field were full on confrontational test. Facial sensation and strength were normal.  Head turning and shoulder shrug  were normal and symmetric. Motor: The motor testing reveals 5 over 5 strength of all 4 extremities. Good symmetric motor tone is noted throughout.   Gait and station: Gait is normal.    DIAGNOSTIC DATA (LABS, IMAGING, TESTING) - I reviewed patient records, labs, notes, testing and imaging myself where available.  Lab Results  Component Value Date   WBC 10.0 10/06/2023   HGB 8.6 (L) 10/06/2023   HCT 25.9 (L) 10/06/2023   MCV 86.0 10/06/2023   PLT 257 10/06/2023      Component Value Date/Time   NA 137 10/06/2023 0458   K 3.6 10/06/2023 0458   CL 105 10/06/2023 0458   CO2 24 10/06/2023 0458   GLUCOSE 196 (H) 10/06/2023 0458   BUN 26 (H) 10/06/2023 0458   CREATININE 0.98 10/06/2023 0458   CALCIUM 8.2 (L) 10/06/2023 0458   PROT 7.3 10/02/2023 1755   ALBUMIN 4.0 10/02/2023 1755   AST 32 10/02/2023 1755   ALT 22 10/02/2023 1755   ALKPHOS 62 10/02/2023  1755   BILITOT 0.7 10/02/2023 1755   GFRNONAA 58 (L) 10/06/2023 0458   No  results found for: "CHOL", "HDL", "LDLCALC", "LDLDIRECT", "TRIG", "CHOLHDL" Lab Results  Component Value Date   HGBA1C 6.5 (H) 07/07/2023   No results found for: "VITAMINB12" No results found for: "TSH"      No data to display              01/15/2023    1:49 PM 07/03/2022    1:50 PM  Montreal Cognitive Assessment   Visuospatial/ Executive (0/5) 4 0  Naming (0/3) 2 3  Attention: Read list of digits (0/2) 2 1  Attention: Read list of letters (0/1) 1 1  Attention: Serial 7 subtraction starting at 100 (0/3) 1 0  Language: Repeat phrase (0/2) 1 0  Language : Fluency (0/1) 0 0  Abstraction (0/2) 2 0  Delayed Recall (0/5) 0 0  Orientation (0/6) 5 0  Total 18 5     ASSESSMENT AND PLAN  83 y.o. year old female  has a past medical history of Arthritis, Bell's palsy, Breast cancer (HCC), Depression, Diabetes mellitus without complication (HCC), Glaucoma, HTN (hypertension), IBS (irritable bowel syndrome), Memory loss, and Vitamin D deficiency. here with    No diagnosis found.  Sherri Garza reports doing well. Sherri Garza feels that short term memory has worsened. MOCA 18/30. She becomes very agitated with testing. We have discussed pros and cons of MRI imaging. Sherri Garza is not interested in imaging at this time. Could consider CT if she is willing. We will continue donepezil 10mg  daily. I will add memantine 5mg  BID. Sherri Garza will start 5mg  daily for 1 week then increase dose to 5mg  BID if she is doing well. Possible side effects reviewed. Healthy lifestyle habits encouraged. She will follow up with PCP as directed. She will return to see me in 6 months, sooner if needed. She verbalizes understanding and agreement with this plan.   No orders of the defined types were placed in this encounter.    No orders of the defined types were placed in this encounter.   I spent 30 minutes of face-to-face and  non-face-to-face time with patient.  This included previsit chart review, lab review, study review, order entry, electronic health record documentation, patient education.   Shawnie Dapper, MSN, FNP-C 11/03/2023, 9:39 AM  [facility] [address] [place], [place] [address] [phone]

## 2023-11-04 ENCOUNTER — Ambulatory Visit (INDEPENDENT_AMBULATORY_CARE_PROVIDER_SITE_OTHER): Payer: Medicare Other | Admitting: Family Medicine

## 2023-11-04 ENCOUNTER — Encounter: Payer: Self-pay | Admitting: Family Medicine

## 2023-11-04 VITALS — BP 130/75 | HR 59 | Ht 62.0 in | Wt 124.0 lb

## 2023-11-04 DIAGNOSIS — F03B3 Unspecified dementia, moderate, with mood disturbance: Secondary | ICD-10-CM

## 2023-11-04 MED ORDER — DONEPEZIL HCL 10 MG PO TBDP: 10.0000 mg | ORAL_TABLET | Freq: Every day | ORAL | 1 refills | Status: DC

## 2023-11-05 DIAGNOSIS — S72012D Unspecified intracapsular fracture of left femur, subsequent encounter for closed fracture with routine healing: Secondary | ICD-10-CM | POA: Diagnosis not present

## 2023-11-05 DIAGNOSIS — C50919 Malignant neoplasm of unspecified site of unspecified female breast: Secondary | ICD-10-CM | POA: Diagnosis not present

## 2023-11-05 DIAGNOSIS — Z96642 Presence of left artificial hip joint: Secondary | ICD-10-CM | POA: Diagnosis not present

## 2023-11-05 DIAGNOSIS — E119 Type 2 diabetes mellitus without complications: Secondary | ICD-10-CM | POA: Diagnosis not present

## 2023-11-05 DIAGNOSIS — N179 Acute kidney failure, unspecified: Secondary | ICD-10-CM | POA: Diagnosis not present

## 2023-11-05 DIAGNOSIS — D630 Anemia in neoplastic disease: Secondary | ICD-10-CM | POA: Diagnosis not present

## 2023-11-07 ENCOUNTER — Encounter: Payer: Self-pay | Admitting: Family Medicine

## 2023-11-10 ENCOUNTER — Other Ambulatory Visit: Payer: Self-pay | Admitting: Family Medicine

## 2023-11-10 MED ORDER — QUETIAPINE FUMARATE 25 MG PO TABS
25.0000 mg | ORAL_TABLET | Freq: Every evening | ORAL | 1 refills | Status: DC | PRN
Start: 2023-11-10 — End: 2024-05-05

## 2023-11-10 MED ORDER — MEMANTINE HCL 5 MG PO TABS: 5.0000 mg | ORAL_TABLET | Freq: Two times a day (BID) | ORAL | 1 refills | Status: DC

## 2023-11-10 NOTE — Telephone Encounter (Signed)
 Per note on 11/04/23 " We will continue donepezil 10mg  daily. Sherri Garza will consider another trial of memantine in the future. Possible side effects reviewed. He was encouraged to call me if she seems to tolerate this medicaiotn and we can consider increasing dose. "

## 2023-11-11 ENCOUNTER — Telehealth: Payer: Self-pay

## 2023-11-11 ENCOUNTER — Other Ambulatory Visit (HOSPITAL_COMMUNITY): Payer: Self-pay

## 2023-11-11 DIAGNOSIS — Z96642 Presence of left artificial hip joint: Secondary | ICD-10-CM | POA: Diagnosis not present

## 2023-11-11 DIAGNOSIS — N179 Acute kidney failure, unspecified: Secondary | ICD-10-CM | POA: Diagnosis not present

## 2023-11-11 DIAGNOSIS — E119 Type 2 diabetes mellitus without complications: Secondary | ICD-10-CM | POA: Diagnosis not present

## 2023-11-11 DIAGNOSIS — S72012D Unspecified intracapsular fracture of left femur, subsequent encounter for closed fracture with routine healing: Secondary | ICD-10-CM | POA: Diagnosis not present

## 2023-11-11 DIAGNOSIS — C50919 Malignant neoplasm of unspecified site of unspecified female breast: Secondary | ICD-10-CM | POA: Diagnosis not present

## 2023-11-11 DIAGNOSIS — D630 Anemia in neoplastic disease: Secondary | ICD-10-CM | POA: Diagnosis not present

## 2023-11-11 NOTE — Telephone Encounter (Signed)
 Sherri Garza from [facility] called with the Approval. She states that the medication has been approved from 11/11/23-11/10/24

## 2023-11-11 NOTE — Telephone Encounter (Signed)
 Pharmacy Patient Advocate Encounter  Received notification from [facility] that Prior Authorization for QUEtiapine Fumarate 25MG  tablets has been APPROVED from 11/11/2023 to 11/10/2024. Ran test claim, Copay is $1.77. This test claim was processed through [facility]- copay amounts may vary at other pharmacies due to pharmacy/plan contracts, or as the patient moves through the different stages of their insurance plan.   PA #/Case ID/Reference #: PA Case ID #: [id number]

## 2023-11-11 NOTE — Telephone Encounter (Signed)
 Pharmacy Patient Advocate Encounter   Received notification from CoverMyMeds that prior authorization for QUEtiapine Fumarate 25MG  tablets is required/requested.   Insurance verification completed.   The patient is insured through [facility] .   Per test claim: PA required; PA submitted to above mentioned insurance via CoverMyMeds Key/confirmation #/EOC [employer] Status is pending

## 2023-11-20 DIAGNOSIS — G8929 Other chronic pain: Secondary | ICD-10-CM | POA: Diagnosis not present

## 2023-11-20 DIAGNOSIS — M25561 Pain in right knee: Secondary | ICD-10-CM | POA: Diagnosis not present

## 2023-11-20 DIAGNOSIS — Z96641 Presence of right artificial hip joint: Secondary | ICD-10-CM | POA: Diagnosis not present

## 2023-11-21 DIAGNOSIS — Z96642 Presence of left artificial hip joint: Secondary | ICD-10-CM | POA: Diagnosis not present

## 2023-11-28 DIAGNOSIS — S72012D Unspecified intracapsular fracture of left femur, subsequent encounter for closed fracture with routine healing: Secondary | ICD-10-CM | POA: Diagnosis not present

## 2023-11-28 DIAGNOSIS — N179 Acute kidney failure, unspecified: Secondary | ICD-10-CM | POA: Diagnosis not present

## 2023-11-28 DIAGNOSIS — E119 Type 2 diabetes mellitus without complications: Secondary | ICD-10-CM | POA: Diagnosis not present

## 2023-11-28 DIAGNOSIS — D630 Anemia in neoplastic disease: Secondary | ICD-10-CM | POA: Diagnosis not present

## 2023-11-28 DIAGNOSIS — C50919 Malignant neoplasm of unspecified site of unspecified female breast: Secondary | ICD-10-CM | POA: Diagnosis not present

## 2023-11-28 DIAGNOSIS — F0393 Unspecified dementia, unspecified severity, with mood disturbance: Secondary | ICD-10-CM | POA: Diagnosis not present

## 2023-11-28 DIAGNOSIS — Z96642 Presence of left artificial hip joint: Secondary | ICD-10-CM | POA: Diagnosis not present

## 2023-12-09 DIAGNOSIS — D473 Essential (hemorrhagic) thrombocythemia: Secondary | ICD-10-CM | POA: Diagnosis not present

## 2024-01-07 ENCOUNTER — Other Ambulatory Visit: Payer: Self-pay

## 2024-03-04 ENCOUNTER — Other Ambulatory Visit: Payer: Self-pay | Admitting: Internal Medicine

## 2024-03-04 DIAGNOSIS — Z0000 Encounter for general adult medical examination without abnormal findings: Secondary | ICD-10-CM | POA: Diagnosis not present

## 2024-03-04 DIAGNOSIS — E559 Vitamin D deficiency, unspecified: Secondary | ICD-10-CM | POA: Diagnosis not present

## 2024-03-04 DIAGNOSIS — F03B3 Unspecified dementia, moderate, with mood disturbance: Secondary | ICD-10-CM | POA: Diagnosis not present

## 2024-03-04 DIAGNOSIS — R519 Headache, unspecified: Secondary | ICD-10-CM | POA: Diagnosis not present

## 2024-03-04 DIAGNOSIS — D649 Anemia, unspecified: Secondary | ICD-10-CM | POA: Diagnosis not present

## 2024-03-04 DIAGNOSIS — E1165 Type 2 diabetes mellitus with hyperglycemia: Secondary | ICD-10-CM | POA: Diagnosis not present

## 2024-03-04 DIAGNOSIS — Z1331 Encounter for screening for depression: Secondary | ICD-10-CM | POA: Diagnosis not present

## 2024-03-04 DIAGNOSIS — Z1231 Encounter for screening mammogram for malignant neoplasm of breast: Secondary | ICD-10-CM

## 2024-03-04 DIAGNOSIS — D72820 Lymphocytosis (symptomatic): Secondary | ICD-10-CM | POA: Diagnosis not present

## 2024-03-04 DIAGNOSIS — R251 Tremor, unspecified: Secondary | ICD-10-CM | POA: Diagnosis not present

## 2024-03-04 DIAGNOSIS — M25561 Pain in right knee: Secondary | ICD-10-CM | POA: Diagnosis not present

## 2024-03-04 DIAGNOSIS — F22 Delusional disorders: Secondary | ICD-10-CM | POA: Diagnosis not present

## 2024-03-04 DIAGNOSIS — I10 Essential (primary) hypertension: Secondary | ICD-10-CM | POA: Diagnosis not present

## 2024-03-04 DIAGNOSIS — E785 Hyperlipidemia, unspecified: Secondary | ICD-10-CM | POA: Diagnosis not present

## 2024-03-04 DIAGNOSIS — S22080D Wedge compression fracture of T11-T12 vertebra, subsequent encounter for fracture with routine healing: Secondary | ICD-10-CM | POA: Diagnosis not present

## 2024-03-08 ENCOUNTER — Other Ambulatory Visit: Payer: Self-pay | Admitting: Internal Medicine

## 2024-03-08 DIAGNOSIS — Z1231 Encounter for screening mammogram for malignant neoplasm of breast: Secondary | ICD-10-CM

## 2024-03-11 DIAGNOSIS — E875 Hyperkalemia: Secondary | ICD-10-CM | POA: Diagnosis not present

## 2024-03-15 DIAGNOSIS — H40003 Preglaucoma, unspecified, bilateral: Secondary | ICD-10-CM | POA: Diagnosis not present

## 2024-03-23 DIAGNOSIS — E119 Type 2 diabetes mellitus without complications: Secondary | ICD-10-CM | POA: Diagnosis not present

## 2024-03-23 DIAGNOSIS — H2513 Age-related nuclear cataract, bilateral: Secondary | ICD-10-CM | POA: Diagnosis not present

## 2024-03-23 DIAGNOSIS — H401131 Primary open-angle glaucoma, bilateral, mild stage: Secondary | ICD-10-CM | POA: Diagnosis not present

## 2024-03-23 DIAGNOSIS — H43813 Vitreous degeneration, bilateral: Secondary | ICD-10-CM | POA: Diagnosis not present

## 2024-04-01 DIAGNOSIS — Z1330 Encounter for screening examination for mental health and behavioral disorders, unspecified: Secondary | ICD-10-CM | POA: Diagnosis not present

## 2024-04-01 DIAGNOSIS — M48061 Spinal stenosis, lumbar region without neurogenic claudication: Secondary | ICD-10-CM | POA: Diagnosis not present

## 2024-04-01 DIAGNOSIS — M545 Low back pain, unspecified: Secondary | ICD-10-CM | POA: Diagnosis not present

## 2024-04-01 DIAGNOSIS — G8929 Other chronic pain: Secondary | ICD-10-CM | POA: Diagnosis not present

## 2024-04-01 DIAGNOSIS — M5126 Other intervertebral disc displacement, lumbar region: Secondary | ICD-10-CM | POA: Diagnosis not present

## 2024-05-04 NOTE — Progress Notes (Unsigned)
 No chief complaint on file.   HISTORY OF PRESENT ILLNESS:  05/04/24 ALL:  Sherri Garza returns for follow up for dementia. She was last seen by me 10/2023 and noted to have progressive worsening. We continued donepezil  and discussed retrial of memantine . Since,   Memantine  Quetiapine ?   11/04/2023 ALL:  Sherri Garza returns for follow up for memory loss. She was last seen 12/2022 and noted to have worsening memory loss and more agitation. We continued donepezil  10mg  daily and added memantine . She had a fall 10/02/2023 and broke her left hip. She underwent partial hip replacement at [facility]. Right hip replaced after fall in 06/2023.   Since, memory has progressively worsened. She has more She does endorse possible visual hallucinations. She tells me that she sees things (unclear what she sees) that she knows are not real. Mr Vanduzer reports that she has delusional thoughts. She can't remember if her parents are alive. She took 1 dose of memantine  5mg  but Mr Rocco reports that she seemed really dizzy that day so he did not continue. She continues quetiapine  through PCP. She does rest well. She continues to work with PT following hip surgery. No clear changes in gait. No falls or assistive devices.   Of note, she has a resting tremor of right arm throughout visit, today. Mr Matusek reports that she shakes constantly at home. She tells me that she can stop the shaking anytime she wants and that she is just anxious. NO family history of LBD or PD. Mr Bewley tells me that she will get agitated very easily but then calm down easily. She doesn't remember getting worked up after event. They are on a wait list for an assisted living apartment with [facility].   01/15/2023 ALL:  Sherri Garza is a 83 y.o. female here today for follow up for memory loss. She was seen in consult with Dr Rush 06/2022. MRI recommended, however, patient refused. She was started on donepezil  and quetiapine  continued. Mr Ramaswamy called 01/13/23  inquiring about need for appt. He reported she refused to continue quetiapine  but has continued donepezil .   Since, she reports doing well. Mr Fluty presents with her and aides in history. He reports she seems to be having more difficulty with short term memory loss. She gets agitated, easily. He has noted that her right hand shakes sometimes when she is trying to eat or if she is upset. She does not have children or any family close by. No significant relationships with neighbors or community members. She does walk around her neighborhood daily. No falls. She sleeps well. Usually 10-11p-8-9a. She is eating normally. She cooks and performs ADLs. She does not drive. Mr Suniga manages meds.    HISTORY (copied from Dr Merna previous note)  The patient presents for evaluation of memory loss which has been present over the past 4 years. Husband states this started after a dental X-ray. For 2 years after this she would wake up in the middle of the night screaming that the dentist was trying to kill her. She would be inconsolable for 1-2 hours each night. This did improve over time but her memory continued to decline, especially over the past year. She has started forgetting names and dates. She cannot remember her cats' names.  She has to write multiple notes to remember things and is misplacing objects all over the house. Forgets conversations the day after she has them. She does continue to get more confused at night, so  she was prescribed aripiprazole by her PCP. This helped with the confusion but she continued to wander around the house at night. She was then prescribed Seroquel , which has been more effective and helped her sleep through the night. She is in denial about her memory loss, stating she chooses to forget things because she does not want to remember bad memories.   She has also developed daily severe right-sided headaches over the past couple of years. She was prescribed indomethacin for headaches,  but this was discontinued as it was reportedly affecting her blood counts.    TBI:  No past history of TBI Stroke:  no past history of stroke Seizures:  no past history of seizures Sleep: She sleeps well at bedtime. She does have increased confusion in the evening, which has improved with Seroquel  Mood: She has become socially isolated since the pandemic. Husband notes she has been more irritable and will say angry things, then will act as if they never fought.   Functional status: Patient lives with her husband Cooking: cooks without issues Shopping: husband does the shopping Driving: She doesn't drive Bills: Husband manages finances,  Medications: Husband helps manage her medications because her blood sugar was getting out of control Ever left the stove on by accident?: no Forget how to use items around the house?: no Getting lost going to familiar places?: no Forgetting loved ones names?: yes Word finding difficulty? none   OTHER MEDICAL CONDITIONS: DM2, glaucoma, vitamin D deficiency   REVIEW OF SYSTEMS: Out of a complete 14 system review of symptoms, the patient complains only of the following symptoms, agitation and all other reviewed systems are negative.   ALLERGIES: Allergies  Allergen Reactions   Prevagen [Apoaequorin] Anaphylaxis   Bacitracin    Ciprofloxacin    Doxycycline    Januvia  [Sitagliptin ]    Metformin  And Related Diarrhea   Penicillins    Tetanus Toxoid-Containing Vaccines Rash     HOME MEDICATIONS: Outpatient Medications Prior to Visit  Medication Sig Dispense Refill   acetaminophen  (TYLENOL ) 650 MG CR tablet Take 650 mg by mouth 2 (two) times daily.     bimatoprost (LUMIGAN) 0.03 % ophthalmic solution Place 1 drop into both eyes at bedtime.     donepezil  (ARICEPT  ODT) 10 MG disintegrating tablet Take 1 tablet (10 mg total) by mouth at bedtime. 90 tablet 1   enoxaparin  (LOVENOX ) 40 MG/0.4ML injection Inject 0.4 mLs (40 mg total) into the skin daily  for 26 days. 10.4 mL 0   glipiZIDE  (GLUCOTROL  XL) 10 MG 24 hr tablet Take 10 mg by mouth daily.     memantine  (NAMENDA ) 5 MG tablet Take 1 tablet (5 mg total) by mouth 2 (two) times daily. 180 tablet 1   Multiple Vitamin (MULTIVITAMIN) tablet Take 1 tablet by mouth daily.     QUEtiapine  (SEROQUEL ) 25 MG tablet Take 1 tablet (25 mg total) by mouth at bedtime as needed (Sleep). 90 tablet 1   SitaGLIPtin -MetFORMIN  HCl (JANUMET  XR) 50-1000 MG TB24 Take 1 tablet by mouth 2 (two) times daily.     No facility-administered medications prior to visit.     PAST MEDICAL HISTORY: Past Medical History:  Diagnosis Date   Arthritis    Bell's palsy    2007 and 2019 brought on by stress   Breast cancer (HCC)    Depression    Diabetes mellitus without complication (HCC)    Glaucoma    HTN (hypertension)    IBS (irritable bowel syndrome)  Memory loss    short term   Vitamin D deficiency      PAST SURGICAL HISTORY: Past Surgical History:  Procedure Laterality Date   APPENDECTOMY     HIP ARTHROPLASTY Left 10/03/2023   Procedure: HEMIARTHROPLASTY (BIPOLAR) HIP, POSTERIOR APPROACH FOR FRACTURE;  Surgeon: Tobie Priest, MD;  Location: [facility] ORS;  Service: Orthopedics;  Laterality: Left;   LAPAROSCOPIC OVARIAN CYSTECTOMY     MASTECTOMY Right 1999   TONSILLECTOMY     TOTAL HIP ARTHROPLASTY Right 07/08/2023   Procedure: TOTAL HIP ARTHROPLASTY ANTERIOR APPROACH;  Surgeon: Ernie Cough, MD;  Location: [facility] ORS;  Service: Orthopedics;  Laterality: Right;     FAMILY HISTORY: Family History  Problem Relation Age of Onset   Cancer Mother    Cancer Father    Prostate cancer Father    Cancer Brother    Lung cancer Brother      SOCIAL HISTORY: Social History   Socioeconomic History   Marital status: Married    Spouse name: Not on file   Number of children: Not on file   Years of education: Not on file   Highest education level: Not on file  Occupational History   Not on file  Tobacco Use    Smoking status: Never   Smokeless tobacco: Never  Vaping Use   Vaping status: Never Used  Substance and Sexual Activity   Alcohol use: Not Currently   Drug use: Never   Sexual activity: Not Currently  Other Topics Concern   Not on file  Social History Narrative   ** Merged History Encounter **       Social Drivers of Health   Financial Resource Strain: Low Risk  (04/01/2024)   Received from [facility]   Overall Financial Resource Strain (CARDIA)    How hard is it for you to pay for the very basics like food, housing, medical care, and heating?: Not hard at all  Food Insecurity: No Food Insecurity (04/01/2024)   [facility] from [facility]   Hunger Vital Sign    Within the past 12 months, you worried that your food would run out before you got the money to buy more.: Never true    Within the past 12 months, the food you bought just didn't last and you didn't have money to get more.: Never true  Transportation Needs: No Transportation Needs (04/01/2024)   Received from [facility] - Transportation    In the past 12 months, has lack of transportation kept you from medical appointments or from getting medications?: No    In the past 12 months, has lack of transportation kept you from meetings, work, or from getting things needed for daily living?: No  Physical Activity: Not on file  Stress: Not on file  Social Connections: Socially Integrated (10/02/2023)   Social Connection and Isolation Panel    Frequency of Communication with Friends and Family: Three times a week    Frequency of Social Gatherings with Friends and Family: Three times a week    Attends Religious Services: 1 to 4 times per year    Active Member of Clubs or Organizations: Yes    Attends Banker Meetings: 1 to 4 times per year    Marital Status: Married  Catering manager Violence: Not At Risk (10/02/2023)   Humiliation, Afraid, Rape, and Kick questionnaire    Fear of Current or Ex-Partner: No     Emotionally Abused: No    Physically Abused: No  Sexually Abused: No     PHYSICAL EXAM  There were no vitals filed for this visit.   There is no height or weight on file to calculate BMI.  Generalized: Well developed, in no acute distress  Cardiology: normal rate and rhythm, no murmur auscultated  Respiratory: clear to auscultation bilaterally    Neurological examination  Mentation: Alert oriented to time, place, and some history taking. Follows all commands speech and language fluent. Easily agitated and repetitive in questioning.  Cranial nerve II-XII: Pupils were equal round reactive to light. Extraocular movements were full, visual field were full on confrontational test. Facial sensation and strength were normal.  Head turning and shoulder shrug  were normal and symmetric. Motor: The motor testing reveals 5 over 5 strength of all 4 extremities. Good symmetric motor tone is noted throughout.   Gait and station: Gait is normal.    DIAGNOSTIC DATA (LABS, IMAGING, TESTING) - I reviewed patient records, labs, notes, testing and imaging myself where available.  Lab Results  Component Value Date   WBC 10.0 10/06/2023   HGB 8.6 (L) 10/06/2023   HCT 25.9 (L) 10/06/2023   MCV 86.0 10/06/2023   PLT 257 10/06/2023      Component Value Date/Time   NA 137 10/06/2023 0458   K 3.6 10/06/2023 0458   CL 105 10/06/2023 0458   CO2 24 10/06/2023 0458   GLUCOSE 196 (H) 10/06/2023 0458   BUN 26 (H) 10/06/2023 0458   CREATININE 0.98 10/06/2023 0458   CALCIUM 8.2 (L) 10/06/2023 0458   PROT 7.3 10/02/2023 1755   ALBUMIN 4.0 10/02/2023 1755   AST 32 10/02/2023 1755   ALT 22 10/02/2023 1755   ALKPHOS 62 10/02/2023 1755   BILITOT 0.7 10/02/2023 1755   GFRNONAA 58 (L) 10/06/2023 0458   No results found for: CHOL, HDL, LDLCALC, LDLDIRECT, TRIG, CHOLHDL Lab Results  Component Value Date   HGBA1C 6.5 (H) 07/07/2023   No results found for: VITAMINB12 No results found  for: TSH      No data to display              11/04/2023    2:00 PM 01/15/2023    1:49 PM 07/03/2022    1:50 PM  Montreal Cognitive Assessment   Visuospatial/ Executive (0/5) 2 4 0  Naming (0/3) 3 2 3   Attention: Read list of digits (0/2) 2 2 1   Attention: Read list of letters (0/1) 0 1 1  Attention: Serial 7 subtraction starting at 100 (0/3) 0 1 0  Language: Repeat phrase (0/2) 0 1 0  Language : Fluency (0/1) 0 0 0  Abstraction (0/2) 2 2 0  Delayed Recall (0/5) 0 0 0  Orientation (0/6) 0 5 0  Total 9 18 5      ASSESSMENT AND PLAN  84 y.o. year old female  has a past medical history of Arthritis, Bell's palsy, Breast cancer (HCC), Depression, Diabetes mellitus without complication (HCC), Glaucoma, HTN (hypertension), IBS (irritable bowel syndrome), Memory loss, and Vitamin D deficiency. here with    No diagnosis found.  Sherri Garza reports doing well. Mr Fulcher feels that short term memory has worsened. MOCA 8/30. She becomes very agitated with testing. We have discussed pros and cons of MRI imaging. Sherri Garza is not interested in imaging at this time. 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 We will continue donepezil  10mg  daily. Mr An will consider another trial of memantine  in the future. Possible  side effects reviewed. He was encouraged to call me if she seems to tolerate this medicaiotn and we can consider increasing dose. Healthy lifestyle habits encouraged. She will follow up with PCP as directed. She will return to see me in 6 months, sooner if needed. She verbalizes understanding and agreement with this plan.   No orders of the defined types were placed in this encounter.    No orders of the defined types were placed in this encounter.   I spent 30 minutes of face-to-face and non-face-to-face time with patient.  This included previsit chart review, lab review, study review, order entry, electronic health  record documentation, patient education.   Greig Forbes, MSN, FNP-C 05/04/2024, 3:16 PM  [facility] [address] [place], [place] [address] [phone]

## 2024-05-04 NOTE — Patient Instructions (Signed)
Below is our plan:  We will continue donepezil 10mg and memantine 10mg daily  Please make sure you are staying well hydrated. I recommend 50-60 ounces daily. Well balanced diet and regular exercise encouraged. Consistent sleep schedule with 6-8 hours recommended.   Please continue follow up with care team as directed.   Follow up with me in 1 year   You may receive a survey regarding today's visit. I encourage you to leave honest feed back as I do use this information to improve patient care. Thank you for seeing me today!   Management of Memory Problems   There are some general things you can do to help manage your memory problems.  Your memory may not in fact recover, but by using techniques and strategies you will be able to manage your memory difficulties better.   1)  Establish a routine. Try to establish and then stick to a regular routine.  By doing this, you will get used to what to expect and you will reduce the need to rely on your memory.  Also, try to do things at the same time of day, such as taking your medication or checking your calendar first thing in the morning. Think about think that you can do as a part of a regular routine and make a list.  Then enter them into a daily planner to remind you.  This will help you establish a routine.   2)  Organize your environment. Organize your environment so that it is uncluttered.  Decrease visual stimulation.  Place everyday items such as keys or cell phone in the same place every day (ie.  Basket next to front door) Use post it notes with a brief message to yourself (ie. Turn off light, lock the door) Use labels to indicate where things go (ie. Which cupboards are for food, dishes, etc.) Keep a notepad and pen by the telephone to take messages   3)  Memory Aids A diary or journal/notebook/daily planner Making a list (shopping list, chore list, to do list that needs to be done) Using an alarm as a reminder (kitchen timer or cell  phone alarm) Using cell phone to store information (Notes, Calendar, Reminders) Calendar/White board placed in a prominent position Post-it notes   In order for memory aids to be useful, you need to have good habits.  It's no good remembering to make a note in your journal if you don't remember to look in it.  Try setting aside a certain time of day to look in journal.   4)  Improving mood and managing fatigue. There may be other factors that contribute to memory difficulties.  Factors, such as anxiety, depression and tiredness can affect memory. Regular gentle exercise can help improve your mood and give you more energy. Exercise: there are short videos created by the National Institute on Health specially for older adults: https://bit.ly/2I30q97.  Mediterranean diet: which emphasizes fruits, vegetables, whole grains, legumes, fish, and other seafood; unsaturated fats such as olive oils; and low amounts of red meat, eggs, and sweets. A variation of this, called MIND (Mediterranean-DASH Intervention for Neurodegenerative Delay) incorporates the DASH (Dietary Approaches to Stop Hypertension) diet, which has been shown to lower high blood pressure, a risk factor for Alzheimer's disease. More information at: https://www.nia.nih.gov/health/what-do-we-know-about-diet-and-prevention-alzheimers-disease.  Aerobic exercise that improve heart health is also good for the mind.  National Institute on Aging have short videos for exercises that you can do at home: www.nia.nih.gov/Go4Life Simple relaxation techniques may help relieve   symptoms of anxiety Try to get back to completing activities or hobbies you enjoyed doing in the past. Learn to pace yourself through activities to decrease fatigue. Find out about some local support groups where you can share experiences with others. Try and achieve 7-8 hours of sleep at night.   Resources for Family/Caregiver  Online caregiver support groups can be found at  alz.org or call Alzheimer's Association's 24/7 hotline: 800.272.3900. [facility] offers in-person, virtual support groups and individual counseling for both care partners and persons with memory loss. Call for more information at [phone].   Advanced care plan: there are two types of Power of Attorney: healthcare and durable. Healthcare POA is a designated person to make healthcare decisions on your behalf if you were too sick to make them yourself. This person can be selected and documented by your physician. Durable POA has to be set up with a lawyer who takes charge of your finances and estate if you were too sick or cognitively impaired to manage your finances accurately. You can find a local Elder Law lawyer here: https://www.naela.org/.  Check out www.planyourlifespan.org, which will help you plan before a crisis and decide who will take care of life considerations in a circumstance where you may not be able to speak for yourself.   Helpful books (available on Amazon or your local bookstore):  By Dr. Ed Shaw: Keeping Love Alive as Memories Fade: The 5 Love Languages and the Alzheimer's Journey Apr 29, 2015 The Dementia Care Partner's Workbook: A Guide for Understanding, Education, and Hope Paperback - December 27, 2017.  Both available for less than $15.   "Coping with behavior change in dementia: a family caregiver's guide" by Beth Spencer & Laurie White "A Caregiver's Guide to Dementia: Using Activities and Other Strategies to Prevent, Reduce and Manage Behavioral Symptoms" by Laura N. Gitlin and Catherine Piersol.  "Creating Moments of Joy for the Person with Alzheimer's or Dementia" 4th edition by Jolene Brackrey  Caregiver videos on common behaviors related to dementia: https://www.[facility].org/dementia/caregiver-education-videos  [facility] Caregiver Portal: free to sign up, links to local resources: [email or web address]/login  

## 2024-05-05 ENCOUNTER — Ambulatory Visit (INDEPENDENT_AMBULATORY_CARE_PROVIDER_SITE_OTHER): Admitting: Family Medicine

## 2024-05-05 ENCOUNTER — Other Ambulatory Visit: Payer: Medicare Other

## 2024-05-05 ENCOUNTER — Encounter: Payer: Self-pay | Admitting: Family Medicine

## 2024-05-05 VITALS — BP 154/78 | HR 61 | Resp 14 | Ht 62.0 in | Wt 135.5 lb

## 2024-05-05 DIAGNOSIS — F03B3 Unspecified dementia, moderate, with mood disturbance: Secondary | ICD-10-CM | POA: Diagnosis not present

## 2024-05-05 MED ORDER — MEMANTINE HCL 5 MG PO TABS: 5.0000 mg | ORAL_TABLET | Freq: Two times a day (BID) | ORAL | 1 refills | Status: AC

## 2024-05-05 MED ORDER — DONEPEZIL HCL 10 MG PO TBDP: 10.0000 mg | ORAL_TABLET | Freq: Every day | ORAL | 1 refills | Status: AC

## 2024-05-05 MED ORDER — QUETIAPINE FUMARATE 25 MG PO TABS: 12.5000 mg | ORAL_TABLET | Freq: Every evening | ORAL | 1 refills | Status: DC | PRN

## 2024-06-15 DIAGNOSIS — N179 Acute kidney failure, unspecified: Secondary | ICD-10-CM | POA: Diagnosis not present

## 2024-08-02 ENCOUNTER — Encounter: Payer: Self-pay | Admitting: Family Medicine

## 2024-08-03 ENCOUNTER — Other Ambulatory Visit: Payer: Self-pay

## 2024-08-03 ENCOUNTER — Other Ambulatory Visit: Payer: Self-pay | Admitting: Family Medicine

## 2024-08-03 MED ORDER — QUETIAPINE FUMARATE 25 MG PO TABS: 25.0000 mg | ORAL_TABLET | Freq: Every evening | ORAL | 5 refills | Status: AC | PRN

## 2025-05-23 ENCOUNTER — Ambulatory Visit: Admitting: Family Medicine
